# Patient Record
Sex: Female | Born: 1965 | Race: White | Hispanic: No | Smoking: Never smoker
Health system: Southern US, Community
[De-identification: ages and names within clinical notes are randomized; demographics above are authoritative.]

## PROBLEM LIST (undated history)

## (undated) DIAGNOSIS — E785 Hyperlipidemia, unspecified: Secondary | ICD-10-CM

## (undated) DIAGNOSIS — I10 Essential (primary) hypertension: Secondary | ICD-10-CM

## (undated) DIAGNOSIS — F419 Anxiety disorder, unspecified: Secondary | ICD-10-CM

## (undated) DIAGNOSIS — R51 Headache: Secondary | ICD-10-CM

## (undated) DIAGNOSIS — F329 Major depressive disorder, single episode, unspecified: Secondary | ICD-10-CM

## (undated) DIAGNOSIS — F32A Depression, unspecified: Secondary | ICD-10-CM

## (undated) HISTORY — DX: Hyperlipidemia, unspecified: E78.5

## (undated) HISTORY — DX: Major depressive disorder, single episode, unspecified: F32.9

## (undated) HISTORY — DX: Anxiety disorder, unspecified: F41.9

## (undated) HISTORY — DX: Essential (primary) hypertension: I10

## (undated) HISTORY — PX: KNEE RECONSTRUCTION: SHX5883

## (undated) HISTORY — DX: Depression, unspecified: F32.A

---

## 2002-02-21 ENCOUNTER — Encounter: Admission: RE | Admit: 2002-02-21 | Discharge: 2002-04-26 | Payer: Self-pay | Admitting: Orthopedic Surgery

## 2003-10-22 ENCOUNTER — Emergency Department (HOSPITAL_COMMUNITY): Admission: EM | Admit: 2003-10-22 | Discharge: 2003-10-23 | Payer: Self-pay | Admitting: Emergency Medicine

## 2004-08-27 ENCOUNTER — Ambulatory Visit: Payer: Self-pay | Admitting: Family Medicine

## 2004-10-02 ENCOUNTER — Ambulatory Visit: Payer: Self-pay | Admitting: Family Medicine

## 2004-10-23 ENCOUNTER — Ambulatory Visit: Payer: Self-pay | Admitting: Family Medicine

## 2004-11-21 ENCOUNTER — Ambulatory Visit: Payer: Self-pay | Admitting: Family Medicine

## 2004-11-21 ENCOUNTER — Other Ambulatory Visit: Admission: RE | Admit: 2004-11-21 | Discharge: 2004-11-21 | Payer: Self-pay | Admitting: Family Medicine

## 2005-01-19 ENCOUNTER — Ambulatory Visit: Payer: Self-pay | Admitting: Family Medicine

## 2005-01-26 ENCOUNTER — Ambulatory Visit: Payer: Self-pay | Admitting: Family Medicine

## 2005-07-08 ENCOUNTER — Ambulatory Visit: Payer: Self-pay | Admitting: Family Medicine

## 2013-07-12 ENCOUNTER — Ambulatory Visit (INDEPENDENT_AMBULATORY_CARE_PROVIDER_SITE_OTHER): Payer: BC Managed Care – PPO | Admitting: Surgery

## 2013-07-12 ENCOUNTER — Encounter (INDEPENDENT_AMBULATORY_CARE_PROVIDER_SITE_OTHER): Payer: Self-pay | Admitting: Surgery

## 2013-07-12 ENCOUNTER — Other Ambulatory Visit (INDEPENDENT_AMBULATORY_CARE_PROVIDER_SITE_OTHER): Payer: Self-pay

## 2013-07-12 NOTE — Progress Notes (Signed)
Re:   Mesa Kinch DOB:   [birth year] MRN:   [id number]  ASSESSMENT AND PLAN: 1.  Morbid obesity   Weight 286, BMI 46.3  Per the 1991 NIH Consensus Statement, the patient is a candidate for bariatric surgery.  The patient attended our initial information session and reviewed the types of bariatric surgery.    The patient is interested in the Sleeve Gastrectomy.  I discussed with the patient the indications and risks of bariatric surgery.  The potential risks of surgery include, but are not limited to, bleeding, infection, leak from the bowel, DVT and PE, open surgery, long term nutrition consequences, and death.  The patient understands the importance of compliance and long term follow-up with our group after surgery.  She is a little undecided about which operation, but is heading to the sleeve for now.  From here we will obtain lab tests, x-rays, nutrition consult, and psych consult.  I also discussed that this is the newest of the weight loss operations being offered.  I have assisted in this operation multiple times, but have just start doing the operation.  2.  HTN x 1 year 3.  Anxiety/Depression 4.  Bilateral knee surgery for chronic dislocation.  She still has some trouble  Chief Complaint  Patient presents with  . Weight Loss Surgery    initial consult/gastric sleeve   REFERRING PHYSICIAN: SLATOSKY,JOHN J., MD  HISTORY OF PRESENT ILLNESS: Eiman Maret is a 48 y.o. (DOB: 03-20-1966)  white  female whose primary care physician is SLATOSKY,JOHN J., MD and comes to me today for weight loss.  Patient describes that the patient has been interested in weight loss surgery for some time. She went to an information session several years ago. She went a more recent information session presented by Dr. Andrey Campanile.  She is interested in a sleeve gastrectomy. She is not know anyone who has a sleeve gastrectomy. Her main weight loss has been to watch her calories. She has tried Atkins in the  past. She had successful weight loss of about 46 pounds with Adipex and 2012 and but when she quit the Adipex, she regained her weight.  She has not chronic GI problems. She has had no prior abdominal surgery.   Past Medical History  Diagnosis Date  . Hyperlipidemia   . Hypertension   . Anxiety   . Depression      Past Surgical History  Procedure Laterality Date  . Knee reconstruction      knee cap     Current Outpatient Prescriptions  Medication Sig Dispense Refill  . ALPRAZolam (XANAX) 0.25 MG tablet       . aspirin 81 MG tablet Take 81 mg by mouth daily.      . citalopram (CELEXA) 40 MG tablet       . fenofibrate (TRICOR) 48 MG tablet       . lisinopril (PRINIVIL,ZESTRIL) 10 MG tablet       . metoprolol succinate (TOPROL-XL) 100 MG 24 hr tablet        No current facility-administered medications for this visit.     Not on File  REVIEW OF SYSTEMS: Skin:  No history of rash.  No history of abnormal moles. Infection:  No history of hepatitis or HIV.  No history of MRSA. Neurologic:  No history of stroke.  No history of seizure.  No history of headaches. Cardiac:  Hypertension x 1 year.  Pulmonary:  Does not smoke cigarettes.  No asthma or bronchitis.  No OSA/CPAP.  Endocrine:  No diabetes. No thyroid disease. Gastrointestinal:  See HPI. No history of stomach disease.  No history of liver disease.  No history of gall bladder disease.  No history of pancreas disease.  No history of colon disease. Urologic:  No history of kidney stones.  No history of bladder infections. GYN:  Last period about 2 weeks ago.  She has no children. Musculoskeletal:  She has had bilateral knee surgery for chronic dislocation.  She still has some trouble with dislocation. Hematologic:  No bleeding disorder.  No history of anemia.  Not anticoagulated. Psycho-social:  The patient is oriented.   The patient has no obvious psychologic or social impairment to understanding our conversation and  plan.  SOCIAL and FAMILY HISTORY: Unmarried. No children. She takes care of her father who has Alzheimer's x 6 years.  She has 2 sisters and one brother.  They rotate taking care of their father on the weekends.  PHYSICAL EXAM: BP 124/80  Pulse 96  Temp(Src) 98.7 F (37.1 C) (Oral)  Resp 15  Ht 5\' 6"  (1.676 m)  Wt 286 lb 9.6 oz (130.001 kg)  BMI 46.28 kg/m2  General: Obese WF who is alert and generally healthy appearing.  HEENT: Normal. Pupils equal. Neck: Supple. No mass.  No thyroid mass. Lymph Nodes:  No supraclavicular or cervical nodes. Lungs: Clear to auscultation and symmetric breath sounds. Heart:  RRR. No murmur or rub. Abdomen: Soft. No mass. No tenderness. No hernia. Normal bowel sounds.  No abdominal scars. Rectal: No mass.  Guaiac negative. Extremities:  Good strength and ROM  in upper and lower extremities. Neurologic:  Grossly intact to motor and sensory function. Psychiatric: Has normal mood and affect. Behavior is normal.   DATA REVIEWED: Not much in Epic  Ovidio Kinavid Alynn Ellithorpe, MD,  [facility] [place] [address]  [address]   [place], [place]    [id number] Phone:  [phone] FAX:  [phone]

## 2013-08-01 ENCOUNTER — Other Ambulatory Visit: Payer: Self-pay

## 2013-08-01 ENCOUNTER — Ambulatory Visit (HOSPITAL_COMMUNITY)
Admission: RE | Admit: 2013-08-01 | Discharge: 2013-08-01 | Disposition: A | Discharge status: Home / Self Care | Payer: BC Managed Care – PPO | Source: Ambulatory Visit | Attending: Surgery | Admitting: Surgery

## 2013-08-01 DIAGNOSIS — Z6841 Body Mass Index (BMI) 40.0 and over, adult: Secondary | ICD-10-CM | POA: Insufficient documentation

## 2013-08-01 DIAGNOSIS — M24469 Recurrent dislocation, unspecified knee: Secondary | ICD-10-CM | POA: Insufficient documentation

## 2013-08-01 DIAGNOSIS — K7689 Other specified diseases of liver: Secondary | ICD-10-CM | POA: Insufficient documentation

## 2013-08-01 DIAGNOSIS — F341 Dysthymic disorder: Secondary | ICD-10-CM | POA: Insufficient documentation

## 2013-08-01 DIAGNOSIS — K8020 Calculus of gallbladder without cholecystitis without obstruction: Secondary | ICD-10-CM | POA: Insufficient documentation

## 2013-08-01 DIAGNOSIS — I10 Essential (primary) hypertension: Secondary | ICD-10-CM | POA: Insufficient documentation

## 2013-08-02 LAB — COMPREHENSIVE METABOLIC PANEL
ALK PHOS: 38 U/L — AB (ref 39–117)
ALT: 16 U/L (ref 0–35)
AST: 17 U/L (ref 0–37)
Albumin: 4 g/dL (ref 3.5–5.2)
BILIRUBIN TOTAL: 0.4 mg/dL (ref 0.2–1.2)
BUN: 13 mg/dL (ref 6–23)
CO2: 21 mEq/L (ref 19–32)
CREATININE: 0.8 mg/dL (ref 0.50–1.10)
Calcium: 9.2 mg/dL (ref 8.4–10.5)
Chloride: 106 mEq/L (ref 96–112)
Glucose, Bld: 116 mg/dL — ABNORMAL HIGH (ref 70–99)
Potassium: 4.2 mEq/L (ref 3.5–5.3)
SODIUM: 137 meq/L (ref 135–145)
Total Protein: 6.6 g/dL (ref 6.0–8.3)

## 2013-08-02 LAB — CBC WITH DIFFERENTIAL/PLATELET
BASOS PCT: 0 % (ref 0–1)
Basophils Absolute: 0 10*3/uL (ref 0.0–0.1)
EOS ABS: 0.4 10*3/uL (ref 0.0–0.7)
Eosinophils Relative: 4 % (ref 0–5)
HEMATOCRIT: 39 % (ref 36.0–46.0)
Hemoglobin: 13.1 g/dL (ref 12.0–15.0)
Lymphocytes Relative: 24 % (ref 12–46)
Lymphs Abs: 2.3 10*3/uL (ref 0.7–4.0)
MCH: 28.4 pg (ref 26.0–34.0)
MCHC: 33.6 g/dL (ref 30.0–36.0)
MCV: 84.6 fL (ref 78.0–100.0)
MONO ABS: 0.4 10*3/uL (ref 0.1–1.0)
Monocytes Relative: 4 % (ref 3–12)
NEUTROS ABS: 6.4 10*3/uL (ref 1.7–7.7)
Neutrophils Relative %: 68 % (ref 43–77)
Platelets: 320 10*3/uL (ref 150–400)
RBC: 4.61 MIL/uL (ref 3.87–5.11)
RDW: 14.7 % (ref 11.5–15.5)
WBC: 9.4 10*3/uL (ref 4.0–10.5)

## 2013-08-02 LAB — TSH: TSH: 2.805 u[IU]/mL (ref 0.350–4.500)

## 2013-08-07 ENCOUNTER — Encounter (HOSPITAL_COMMUNITY)
Admission: RE | Disposition: A | Discharge status: Home / Self Care | Payer: Self-pay | Source: Ambulatory Visit | Attending: Surgery

## 2013-08-07 ENCOUNTER — Ambulatory Visit (HOSPITAL_COMMUNITY)
Admission: RE | Admit: 2013-08-07 | Discharge: 2013-08-07 | Disposition: A | Discharge status: Home / Self Care | Payer: BC Managed Care – PPO | Source: Ambulatory Visit | Attending: Surgery | Admitting: Surgery

## 2013-08-07 HISTORY — PX: BREATH TEK H PYLORI: SHX5422

## 2013-08-07 SURGERY — BREATH TEST, FOR HELICOBACTER PYLORI

## 2013-08-07 NOTE — Progress Notes (Signed)
08/07/13 [id number]  BREATH TEK ASSESSMENT  Referring MD Ovidio Kinavid Newman  Time of Last PO Intake 1800  Baseline Breath At: 0750  Pranactin Given At: 0752  Post-Dose Breath At: 0807  Sample 1 2.6  Sample 2 2.5  Test Negative

## 2013-08-08 ENCOUNTER — Telehealth (INDEPENDENT_AMBULATORY_CARE_PROVIDER_SITE_OTHER): Payer: Self-pay

## 2013-08-08 ENCOUNTER — Encounter (HOSPITAL_COMMUNITY): Payer: Self-pay | Admitting: Surgery

## 2013-08-21 ENCOUNTER — Encounter: Payer: BC Managed Care – PPO | Attending: Surgery | Admitting: Dietician

## 2013-08-21 ENCOUNTER — Encounter: Payer: Self-pay | Admitting: Dietician

## 2013-08-21 DIAGNOSIS — Z713 Dietary counseling and surveillance: Secondary | ICD-10-CM | POA: Insufficient documentation

## 2013-08-21 DIAGNOSIS — Z01818 Encounter for other preprocedural examination: Secondary | ICD-10-CM | POA: Insufficient documentation

## 2013-08-21 NOTE — Progress Notes (Signed)
  Pre-Op Assessment Visit:  Pre-Operative Sleeve Gastrectomy Surgery  Medical Nutrition Therapy:  Appt start time: 1350   End time:  1430.  Patient was seen on 08/21/2013 for Pre-Operative Sleeve Gastrectomy Nutrition Assessment. Assessment and letter of approval faxed to [facility] coordinator on 08/21/2013.   Preferred Learning Style:   No preference indicated   Learning Readiness:   Ready  Handouts given during visit include:  Pre-Op Goals Bariatric Surgery Protein Shakes  Teaching Method Utilized:  Visual Auditory Hands on  Barriers to learning/adherence to lifestyle change: none  Demonstrated degree of understanding via:  Teach Back   Patient to call the [facility] to enroll in Pre-Op and Post-Op Nutrition Education when surgery date is scheduled.

## 2013-08-21 NOTE — Patient Instructions (Signed)
Work on [employer] CorporationPre Op Goals. Call [facility] once surgery is scheduled to enroll in Pre-Op Class. Check to to see if protein is less than 5 g of carbs or email me [email or web address].

## 2013-09-21 ENCOUNTER — Other Ambulatory Visit (INDEPENDENT_AMBULATORY_CARE_PROVIDER_SITE_OTHER): Payer: Self-pay | Admitting: Surgery

## 2013-10-02 ENCOUNTER — Encounter: Payer: BC Managed Care – PPO | Attending: Surgery

## 2013-10-02 DIAGNOSIS — Z01818 Encounter for other preprocedural examination: Secondary | ICD-10-CM | POA: Insufficient documentation

## 2013-10-02 DIAGNOSIS — Z713 Dietary counseling and surveillance: Secondary | ICD-10-CM | POA: Insufficient documentation

## 2013-10-03 NOTE — Progress Notes (Signed)
  Pre-Operative Nutrition Class:  Appt start time: 830   End time:  930.  Patient was seen on 10/02/2013 for Pre-Operative Bariatric Surgery Education at [facility].   Surgery date: 10/17/2013 Surgery type: Gastric sleeve Start weight at [facility]: 275 lbs on 08/21/2013 Weight today: 265.5 lbs  TANITA  BODY COMP RESULTS  10/02/2013   BMI (kg/m^2) 42.9   Fat Mass (lbs) 118.5   Fat Free Mass (lbs) 147   Total Body Water (lbs) 107.5   Samples given per MNT protocol. Patient educated on appropriate usage:  Unjury protein powder (chocolate - qty 1) Lot #: [id number] Exp: 09/2014  Premier protein shake (strawberry - qty 1) Lot#: [id number] Exp: 03/2014  Bariatric Advantage calcium citrate (cinnamon - qty 1) Lot #: [id number] Exp: 11/2013  Bariactiv MVI (qty 1) Lot #: [id number] Exp: 08/2014  The following the learning objectives were met by the patient during this course:  Identify Pre-Op Dietary Goals and will begin 2 weeks pre-operatively  Identify appropriate sources of fluids and proteins   State protein recommendations and appropriate sources pre and post-operatively  Identify Post-Operative Dietary Goals and will follow for 2 weeks post-operatively  Identify appropriate multivitamin and calcium sources  Describe the need for physical activity post-operatively and will follow MD recommendations  State when to call healthcare [facility] regarding medication questions or post-operative complications  Handouts given during class include:  Pre-Op Bariatric Surgery Diet Handout  Protein Shake Handout  Post-Op Bariatric Surgery Nutrition Handout  BELT Program Information Flyer  Support Group Information Flyer  [facility] Bariatric Supplements Price List  Follow-Up Plan: Patient will follow-up at [facility] 2 weeks post operatively for diet advancement per MD.

## 2013-10-05 ENCOUNTER — Encounter (HOSPITAL_COMMUNITY): Payer: Self-pay | Admitting: Pharmacy Technician

## 2013-10-11 ENCOUNTER — Encounter (INDEPENDENT_AMBULATORY_CARE_PROVIDER_SITE_OTHER): Payer: Self-pay | Admitting: Surgery

## 2013-10-11 ENCOUNTER — Ambulatory Visit (INDEPENDENT_AMBULATORY_CARE_PROVIDER_SITE_OTHER): Payer: BC Managed Care – PPO | Admitting: Surgery

## 2013-10-11 NOTE — Progress Notes (Signed)
Re:   Alison Burch DOB:   1966/02/11 MRN:   [id number]  ASSESSMENT AND PLAN: 1.  Morbid obesity   Weight 286, BMI 46.3  Per the [place], the patient is a candidate for bariatric surgery.  The patient attended our initial information session and reviewed the types of bariatric surgery.    The patient is interested in the Sleeve Gastrectomy.  I discussed with the patient the indications and risks of bariatric surgery.  The potential risks of surgery include, but are not limited to, bleeding, infection, leak from the bowel, DVT and PE, open surgery, long term nutrition consequences, and death.  The patient understands the importance of compliance and long term follow-up with our group after surgery.  She is for surgery 10/17/2013.  Her sister, [name], acts as her POA.  I gave her a prescription for Oxycodone elixir - 200 cc.   2.  HTN x 1 year 3.  Anxiety/Depression 4.  Bilateral knee surgery for chronic dislocation.  She still has some trouble 5.  Asymptomatic gallstones - I gave her a copy of the [place] report  Chief Complaint  Patient presents with  . Bariatric Follow Up   REFERRING PHYSICIAN: SLATOSKY,JOHN J., MD  HISTORY OF PRESENT ILLNESS: Alison Burch is a 48 y.o. (DOB: [birth year])  white  female whose primary care physician is SLATOSKY,JOHN J., MD and comes to me today for weight loss.  Comes by herself.  She still has not met someone who has had a sleeve gastrectomy, but she feels that she knows the surgery well.  I reviewed all her tests with her and gave her copies of her [place] report.  She ask about cirrhosis of the liver.  Apparently her mother has cirrhosis.  She has some steatosis on the [place], but this is reversible with weight loss.  She has lost 23 pounds since I saw her last time.  This is a good sign that she is plugged into the surgery and its effects. UGI - 08/01/2013 - Normal US abdomen - 08/01/2013 -  Gallstones (copy to patient) She saw Dr.  Ardath Sax for psych - 08/26/2013 - he thought she was a good candidate  History of weight loss: Patient describes that the patient has been interested in weight loss surgery for some time. She went to an information session several years ago. She went a more recent information session presented by Dr. Redmond Pulling. She is interested in a sleeve gastrectomy. She is not know anyone who has a sleeve gastrectomy. Her main weight loss has been to watch her calories. She has tried Atkins in the past. She had successful weight loss of about 46 pounds with Adipex and 2012 and but when she quit the Adipex, she regained her weight.  She has not chronic GI problems. She has had no prior abdominal surgery.   Past Medical History  Diagnosis Date  . Hyperlipidemia   . Hypertension   . Anxiety   . Depression      Past Surgical History  Procedure Laterality Date  . Knee reconstruction      knee cap  . Breath tek h pylori N/A 08/07/2013    Procedure: BREATH TEK H PYLORI;  Surgeon: Shann Medal, MD;  Location: [facility];  Service: General;  Laterality: N/A;     Current Outpatient Prescriptions  Medication Sig Dispense Refill  . Acetaminophen-Aspirin Buffered (EXCEDRIN BACK & BODY PO) Take 1 tablet by mouth every 8 (eight) hours as needed (for  pain).      Marland Kitchen ALPRAZolam (XANAX) 0.25 MG tablet Take 0.25 mg by mouth at bedtime as needed for sleep.       . citalopram (CELEXA) 40 MG tablet Take 40 mg by mouth every morning.       . fenofibrate (TRICOR) 48 MG tablet Take 48 mg by mouth daily.       Marland Kitchen ibuprofen (ADVIL,MOTRIN) 200 MG tablet Take 800 mg by mouth every 6 (six) hours as needed for mild pain or moderate pain.      Marland Kitchen lisinopril (PRINIVIL,ZESTRIL) 10 MG tablet Take 10 mg by mouth every morning.       . Melatonin 10 MG TABS Take 10 mg by mouth at bedtime as needed (for sleep).       . metoprolol succinate (TOPROL-XL) 100 MG 24 hr tablet Take 100 mg by mouth every morning.        No current  facility-administered medications for this visit.     No Known Allergies  REVIEW OF SYSTEMS: Skin:  No history of rash.  No history of abnormal moles. Infection:  No history of hepatitis or HIV.  No history of MRSA. Neurologic:  No history of stroke.  No history of seizure.  No history of headaches. Cardiac:  Hypertension x 1 year.  Pulmonary:  Does not smoke cigarettes.  No asthma or bronchitis.  No OSA/CPAP.  Endocrine:  No diabetes. No thyroid disease. Gastrointestinal:  See HPI. No history of stomach disease.  No history of liver disease.  No history of gall bladder disease.  No history of pancreas disease.  No history of colon disease. Urologic:  No history of kidney stones.  No history of bladder infections. GYN:  Last period about 2 weeks ago.  She has no children. Musculoskeletal:  She has had bilateral knee surgery for chronic dislocation.  She still has some trouble with dislocation. Hematologic:  No bleeding disorder.  No history of anemia.  Not anticoagulated. Psycho-social:  She saw Dr. Ardath Sax.  History of depression and anxiety.  SOCIAL and FAMILY HISTORY: Unmarried. No children. She takes care of her father who has Alzheimer's x 6 years.  She has no other job. She has 2 sisters and one brother.  They rotate taking care of their father on the weekends. Her sister, [name], acts as her POA.  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  Her husband has had colon issues and [name] has had to miss work to take care of her.  So it sounds like no one will be with [name] the day of surgery.  PHYSICAL EXAM: BP 134/70  Pulse 76  Temp(Src) 98.1 F (36.7 C)  Ht $R'5\' 6"'Ix$  (1.676 m)  Wt 263 lb (119.296 kg)  BMI 42.47 kg/m2  General: Obese WF who is alert and generally healthy appearing.  HEENT: Normal. Pupils equal. Neck: Supple. No mass.  No thyroid mass. Lymph Nodes:  No supraclavicular or cervical nodes. Lungs: Clear to auscultation and symmetric breath  sounds. Heart:  RRR. No murmur or rub.  Abdomen: Soft. No mass. No tenderness. No hernia. Normal bowel sounds.  No abdominal scars.  She is about half apple and half pear. Extremities:  Good strength and ROM  in upper and lower extremities. Neurologic:  Grossly intact to motor and sensory function. Psychiatric: Has normal mood and affect. Behavior is normal.   DATA REVIEWED: Not much in [place], MD,  [facility] [place]  [address]  [place], [place]    [place] Phone:  [phone] FAX:  [phone]

## 2013-10-12 NOTE — Patient Instructions (Signed)
Alison Burch  10/12/2013   Your procedure is scheduled on:  10/17/2013  1020am-1255pm  [facility]  Follow the Signs to Short Stay Center at 0820       am  Call this number if you have problems the morning of surgery: [phone]   Remember:   Do not eat food or drink liquids after midnight.   Take these medicines the morning of surgery with A SIP OF WATER:    Do not wear jewelry, make-up or nail polish.  Do not wear lotions, powders, or perfumes. , deodorant   Do not shave 48 hours prior to surgery.   Do not bring valuables to the hospital.  Contacts, dentures or bridgework may not be worn into surgery.  Leave suitcase in the car. After surgery it may be brought to your room.  For patients admitted to the hospital, checkout time is 11:00 AM the day of  discharge.    Please read over the following fact sheets that you were [facility] Pre[facility] Before surgery, you can play an important role.  Because skin is not sterile, your skin needs to be as free of germs as possible.  You can reduce the number of germs on your skin by washing with CHG (chlorahexidine gluconate) soap before surgery.  CHG is an antiseptic cleaner which kills germs and bonds with the skin to continue killing germs even after washing. Please DO NOT use if you have an allergy to CHG or antibacterial soaps.  If your skin becomes reddened/irritated stop using the CHG and inform your nurse when you arrive at Short Stay. Do not shave (including legs and underarms) for at least 48 hours prior to the first CHG shower.  You may shave your face/neck. Please follow these instructions carefully:  1.  Shower with CHG Soap the night before surgery and the  morning of Surgery.  2.  If you choose to wash your hair, wash your hair first as usual with your  normal  shampoo.  3.  After you shampoo, rinse your hair and body thoroughly to remove the  shampoo.                           4.  Use CHG as  you would any other liquid soap.  You can apply chg directly  to the skin and wash                       Gently with a scrungie or clean washcloth.  5.  Apply the CHG Soap to your body ONLY FROM THE NECK DOWN.   Do not use on face/ open                           Wound or open sores. Avoid contact with eyes, ears mouth and genitals (private parts).                       Wash face,  Genitals (private parts) with your normal soap.             6.  Wash thoroughly, paying special attention to the area where your surgery  will be performed.  7.  Thoroughly rinse your body with warm water from the neck down.  8.  DO NOT shower/wash with your normal soap after using and rinsing off  the CHG Soap.                9.  Pat yourself dry with a clean towel.            10.  Wear clean pajamas.            11.  Place clean sheets on your bed the night of your first shower and do not  sleep with pets. Day of Surgery : Do not apply any lotions/deodorants the morning of surgery.  Please wear clean clothes to the hospital/surgery center.  FAILURE TO FOLLOW THESE INSTRUCTIONS MAY RESULT IN THE CANCELLATION OF YOUR SURGERY PATIENT SIGNATURE_________________________________  NURSE SIGNATURE__________________________________  ________________________________________________________________________  coughing and deep breathing exercises, leg exercises

## 2013-10-13 ENCOUNTER — Ambulatory Visit (HOSPITAL_COMMUNITY)
Admission: RE | Admit: 2013-10-13 | Discharge: 2013-10-13 | Disposition: A | Discharge status: Home / Self Care | Payer: BC Managed Care – PPO | Source: Ambulatory Visit | Attending: Surgery | Admitting: Surgery

## 2013-10-13 ENCOUNTER — Encounter (HOSPITAL_COMMUNITY)
Admission: RE | Admit: 2013-10-13 | Discharge: 2013-10-13 | Disposition: A | Discharge status: Home / Self Care | Payer: BC Managed Care – PPO | Source: Ambulatory Visit | Attending: Surgery | Admitting: Surgery

## 2013-10-13 ENCOUNTER — Encounter (HOSPITAL_COMMUNITY): Payer: Self-pay

## 2013-10-13 DIAGNOSIS — Z01812 Encounter for preprocedural laboratory examination: Secondary | ICD-10-CM | POA: Insufficient documentation

## 2013-10-13 DIAGNOSIS — Z01818 Encounter for other preprocedural examination: Secondary | ICD-10-CM | POA: Insufficient documentation

## 2013-10-13 HISTORY — DX: Headache: R51

## 2013-10-13 LAB — COMPREHENSIVE METABOLIC PANEL
ALK PHOS: 44 U/L (ref 39–117)
ALT: 11 U/L (ref 0–35)
AST: 14 U/L (ref 0–37)
Albumin: 3.7 g/dL (ref 3.5–5.2)
BUN: 16 mg/dL (ref 6–23)
CO2: 21 mEq/L (ref 19–32)
Calcium: 9.3 mg/dL (ref 8.4–10.5)
Chloride: 101 mEq/L (ref 96–112)
Creatinine, Ser: 0.75 mg/dL (ref 0.50–1.10)
GFR calc Af Amer: >90 mL/min (ref >90)
GFR calc non Af Amer: >90 mL/min (ref >90)
GLUCOSE: 117 mg/dL — AB (ref 70–99)
POTASSIUM: 4.5 meq/L (ref 3.7–5.3)
SODIUM: 135 meq/L — AB (ref 137–147)
Total Bilirubin: 0.5 mg/dL (ref 0.3–1.2)
Total Protein: 7.3 g/dL (ref 6.0–8.3)

## 2013-10-13 LAB — CBC WITH DIFFERENTIAL/PLATELET
BASOS ABS: 0 10*3/uL (ref 0.0–0.1)
Basophils Relative: 0 % (ref 0–1)
EOS ABS: 0.2 10*3/uL (ref 0.0–0.7)
EOS PCT: 2 % (ref 0–5)
HCT: 39.4 % (ref 36.0–46.0)
Hemoglobin: 13.6 g/dL (ref 12.0–15.0)
LYMPHS ABS: 2 10*3/uL (ref 0.7–4.0)
LYMPHS PCT: 20 % (ref 12–46)
MCH: 28.7 pg (ref 26.0–34.0)
MCHC: 34.5 g/dL (ref 30.0–36.0)
MCV: 83.1 fL (ref 78.0–100.0)
Monocytes Absolute: 0.5 10*3/uL (ref 0.1–1.0)
Monocytes Relative: 5 % (ref 3–12)
NEUTROS PCT: 73 % (ref 43–77)
Neutro Abs: 7.1 10*3/uL (ref 1.7–7.7)
PLATELETS: 327 10*3/uL (ref 150–400)
RBC: 4.74 MIL/uL (ref 3.87–5.11)
RDW: 13.1 % (ref 11.5–15.5)
WBC: 9.7 10*3/uL (ref 4.0–10.5)

## 2013-10-13 LAB — HCG, SERUM, QUALITATIVE: Preg, Serum: NEGATIVE

## 2013-10-13 NOTE — Progress Notes (Signed)
EKG- 08/01/13 EPIC  CXR 10/13/12 EPIC

## 2013-10-17 ENCOUNTER — Encounter (HOSPITAL_COMMUNITY): Payer: BC Managed Care – PPO | Admitting: Certified Registered Nurse Anesthetist

## 2013-10-17 ENCOUNTER — Encounter (HOSPITAL_COMMUNITY)
Admission: RE | Disposition: A | Discharge status: Home / Self Care | Payer: Self-pay | Source: Ambulatory Visit | Attending: Surgery

## 2013-10-17 ENCOUNTER — Encounter (HOSPITAL_COMMUNITY): Payer: Self-pay | Admitting: *Deleted

## 2013-10-17 ENCOUNTER — Ambulatory Visit (HOSPITAL_COMMUNITY): Payer: BC Managed Care – PPO | Admitting: Certified Registered Nurse Anesthetist

## 2013-10-17 ENCOUNTER — Inpatient Hospital Stay (HOSPITAL_COMMUNITY)
Admission: RE | Admit: 2013-10-17 | Discharge: 2013-10-19 | DRG: 621 | Disposition: A | Discharge status: Home / Self Care | Payer: BC Managed Care – PPO | Source: Ambulatory Visit | Attending: Surgery | Admitting: Surgery

## 2013-10-17 DIAGNOSIS — F411 Generalized anxiety disorder: Secondary | ICD-10-CM | POA: Diagnosis present

## 2013-10-17 DIAGNOSIS — E785 Hyperlipidemia, unspecified: Secondary | ICD-10-CM

## 2013-10-17 DIAGNOSIS — F3289 Other specified depressive episodes: Secondary | ICD-10-CM | POA: Diagnosis present

## 2013-10-17 DIAGNOSIS — Z79899 Other long term (current) drug therapy: Secondary | ICD-10-CM | POA: Diagnosis not present

## 2013-10-17 DIAGNOSIS — K8020 Calculus of gallbladder without cholecystitis without obstruction: Secondary | ICD-10-CM | POA: Diagnosis present

## 2013-10-17 DIAGNOSIS — F329 Major depressive disorder, single episode, unspecified: Secondary | ICD-10-CM | POA: Diagnosis present

## 2013-10-17 DIAGNOSIS — I10 Essential (primary) hypertension: Secondary | ICD-10-CM | POA: Diagnosis present

## 2013-10-17 DIAGNOSIS — Z6841 Body Mass Index (BMI) 40.0 and over, adult: Secondary | ICD-10-CM

## 2013-10-17 HISTORY — PX: LAPAROSCOPIC GASTRIC SLEEVE RESECTION: SHX5895

## 2013-10-17 HISTORY — PX: UPPER GI ENDOSCOPY: SHX6162

## 2013-10-17 LAB — HEMOGLOBIN AND HEMATOCRIT, BLOOD
HEMATOCRIT: 38.8 % (ref 36.0–46.0)
Hemoglobin: 13 g/dL (ref 12.0–15.0)

## 2013-10-17 SURGERY — GASTRECTOMY, SLEEVE, LAPAROSCOPIC
Anesthesia: General

## 2013-10-17 MED ORDER — KETAMINE HCL 10 MG/ML IJ SOLN
INTRAMUSCULAR | Status: DC | PRN
  Administered 2013-10-17: 20 mg via INTRAVENOUS

## 2013-10-17 MED ORDER — GLYCOPYRROLATE 0.2 MG/ML IJ SOLN
INTRAMUSCULAR | Status: DC | PRN
  Administered 2013-10-17: .8 mg via INTRAVENOUS

## 2013-10-17 MED ORDER — MORPHINE SULFATE 2 MG/ML IJ SOLN
2.0000 mg | INTRAMUSCULAR | Status: DC | PRN
  Administered 2013-10-17 – 2013-10-18 (×2): 2 mg via INTRAVENOUS
  Administered 2013-10-18 (×3): 4 mg via INTRAVENOUS
  Administered 2013-10-18: 2 mg via INTRAVENOUS
  Filled 2013-10-17: qty 1
  Filled 2013-10-17 (×3): qty 2
  Filled 2013-10-17 (×2): qty 1

## 2013-10-17 MED ORDER — ROCURONIUM BROMIDE 100 MG/10ML IV SOLN
INTRAVENOUS | Status: AC
  Filled 2013-10-17: qty 1

## 2013-10-17 MED ORDER — BUPIVACAINE HCL 0.25 % IJ SOLN
INTRAMUSCULAR | Status: DC | PRN
  Administered 2013-10-17: 30 mL

## 2013-10-17 MED ORDER — TISSEEL VH 10 ML EX KIT
PACK | CUTANEOUS | Status: DC | PRN
  Administered 2013-10-17: 2

## 2013-10-17 MED ORDER — HYDROMORPHONE HCL PF 1 MG/ML IJ SOLN
INTRAMUSCULAR | Status: AC
  Filled 2013-10-17: qty 1

## 2013-10-17 MED ORDER — BUPIVACAINE HCL (PF) 0.25 % IJ SOLN
INTRAMUSCULAR | Status: AC
  Filled 2013-10-17: qty 30

## 2013-10-17 MED ORDER — CHLORHEXIDINE GLUCONATE 4 % EX LIQD: 60.0000 mL | Freq: Once | CUTANEOUS | Status: DC

## 2013-10-17 MED ORDER — KETAMINE HCL 10 MG/ML IJ SOLN
INTRAMUSCULAR | Status: AC
  Filled 2013-10-17: qty 1

## 2013-10-17 MED ORDER — MIDAZOLAM HCL 2 MG/2ML IJ SOLN
INTRAMUSCULAR | Status: AC
  Filled 2013-10-17: qty 2

## 2013-10-17 MED ORDER — UNJURY VANILLA POWDER
2.0000 [oz_av] | Freq: Four times a day (QID) | ORAL | Status: DC
  Administered 2013-10-19: 09:00:00 via ORAL

## 2013-10-17 MED ORDER — LIDOCAINE HCL (CARDIAC) 20 MG/ML IV SOLN
INTRAVENOUS | Status: AC
  Filled 2013-10-17: qty 5

## 2013-10-17 MED ORDER — METOPROLOL TARTRATE 1 MG/ML IV SOLN
5.0000 mg | Freq: Four times a day (QID) | INTRAVENOUS | Status: DC
  Administered 2013-10-17 – 2013-10-19 (×7): 5 mg via INTRAVENOUS
  Filled 2013-10-17 (×11): qty 5

## 2013-10-17 MED ORDER — MEPERIDINE HCL 50 MG/ML IJ SOLN: 6.2500 mg | INTRAMUSCULAR | Status: DC | PRN

## 2013-10-17 MED ORDER — FENTANYL CITRATE 0.05 MG/ML IJ SOLN
INTRAMUSCULAR | Status: DC | PRN
  Administered 2013-10-17 (×4): 50 ug via INTRAVENOUS
  Administered 2013-10-17 (×2): 25 ug via INTRAVENOUS
  Administered 2013-10-17 (×2): 50 ug via INTRAVENOUS

## 2013-10-17 MED ORDER — HEPARIN SODIUM (PORCINE) 5000 UNIT/ML IJ SOLN
5000.0000 [IU] | Freq: Three times a day (TID) | INTRAMUSCULAR | Status: DC
  Administered 2013-10-17 – 2013-10-19 (×5): 5000 [IU] via SUBCUTANEOUS
  Filled 2013-10-17 (×8): qty 1

## 2013-10-17 MED ORDER — ONDANSETRON HCL 4 MG/2ML IJ SOLN
INTRAMUSCULAR | Status: AC
  Filled 2013-10-17: qty 2

## 2013-10-17 MED ORDER — NEOSTIGMINE METHYLSULFATE 10 MG/10ML IV SOLN
INTRAVENOUS | Status: DC | PRN
  Administered 2013-10-17: 5 mg via INTRAVENOUS

## 2013-10-17 MED ORDER — DEXTROSE 5 % IV SOLN
INTRAVENOUS | Status: AC
  Filled 2013-10-17: qty 2

## 2013-10-17 MED ORDER — OXYCODONE HCL 5 MG/5ML PO SOLN
5.0000 mg | ORAL | Status: DC | PRN
  Administered 2013-10-18: 5 mg via ORAL
  Administered 2013-10-18: 10 mg via ORAL
  Administered 2013-10-19 (×2): 5 mg via ORAL
  Filled 2013-10-17: qty 25
  Filled 2013-10-17 (×2): qty 10

## 2013-10-17 MED ORDER — NEOSTIGMINE METHYLSULFATE 10 MG/10ML IV SOLN
INTRAVENOUS | Status: AC
  Filled 2013-10-17: qty 1

## 2013-10-17 MED ORDER — CEFOXITIN SODIUM 2 G IV SOLR
2.0000 g | INTRAVENOUS | Status: AC
  Administered 2013-10-17: 2 g via INTRAVENOUS

## 2013-10-17 MED ORDER — GLYCOPYRROLATE 0.2 MG/ML IJ SOLN
INTRAMUSCULAR | Status: AC
  Filled 2013-10-17: qty 3

## 2013-10-17 MED ORDER — FENTANYL CITRATE 0.05 MG/ML IJ SOLN
INTRAMUSCULAR | Status: AC
  Filled 2013-10-17: qty 5

## 2013-10-17 MED ORDER — SUCCINYLCHOLINE CHLORIDE 20 MG/ML IJ SOLN
INTRAMUSCULAR | Status: DC | PRN
  Administered 2013-10-17: 140 mg via INTRAVENOUS

## 2013-10-17 MED ORDER — ROCURONIUM BROMIDE 100 MG/10ML IV SOLN
INTRAVENOUS | Status: DC | PRN
  Administered 2013-10-17 (×2): 5 mg via INTRAVENOUS
  Administered 2013-10-17 (×3): 10 mg via INTRAVENOUS
  Administered 2013-10-17: 40 mg via INTRAVENOUS

## 2013-10-17 MED ORDER — PROMETHAZINE HCL 25 MG/ML IJ SOLN
6.2500 mg | INTRAMUSCULAR | Status: DC | PRN
Start: 2013-10-17 — End: 2013-10-17

## 2013-10-17 MED ORDER — EPHEDRINE SULFATE 50 MG/ML IJ SOLN
INTRAMUSCULAR | Status: AC
  Filled 2013-10-17: qty 1

## 2013-10-17 MED ORDER — PHENYLEPHRINE 40 MCG/ML (10ML) SYRINGE FOR IV PUSH (FOR BLOOD PRESSURE SUPPORT)
PREFILLED_SYRINGE | INTRAVENOUS | Status: AC
  Filled 2013-10-17: qty 10

## 2013-10-17 MED ORDER — ACETAMINOPHEN 160 MG/5ML PO SOLN: 650.0000 mg | ORAL | Status: DC | PRN

## 2013-10-17 MED ORDER — GLYCOPYRROLATE 0.2 MG/ML IJ SOLN
INTRAMUSCULAR | Status: AC
  Filled 2013-10-17: qty 4

## 2013-10-17 MED ORDER — PROPOFOL 10 MG/ML IV BOLUS
INTRAVENOUS | Status: DC | PRN
  Administered 2013-10-17: 200 mg via INTRAVENOUS

## 2013-10-17 MED ORDER — FENTANYL CITRATE 0.05 MG/ML IJ SOLN
INTRAMUSCULAR | Status: AC
  Filled 2013-10-17: qty 2

## 2013-10-17 MED ORDER — PHENYLEPHRINE HCL 10 MG/ML IJ SOLN
INTRAMUSCULAR | Status: DC | PRN
  Administered 2013-10-17: 40 ug via INTRAVENOUS
  Administered 2013-10-17 (×2): 80 ug via INTRAVENOUS
  Administered 2013-10-17: 40 ug via INTRAVENOUS
  Administered 2013-10-17: 80 ug via INTRAVENOUS

## 2013-10-17 MED ORDER — LACTATED RINGERS IR SOLN
Status: DC | PRN
  Administered 2013-10-17: 3000 mL

## 2013-10-17 MED ORDER — KCL IN DEXTROSE-NACL 20-5-0.45 MEQ/L-%-% IV SOLN
INTRAVENOUS | Status: DC
  Administered 2013-10-17 – 2013-10-18 (×3): via INTRAVENOUS
  Administered 2013-10-18 (×2): 150 mL via INTRAVENOUS
  Administered 2013-10-19: 01:00:00 via INTRAVENOUS
  Filled 2013-10-17 (×7): qty 1000

## 2013-10-17 MED ORDER — DEXAMETHASONE SODIUM PHOSPHATE 10 MG/ML IJ SOLN
INTRAMUSCULAR | Status: DC | PRN
  Administered 2013-10-17: 10 mg via INTRAVENOUS

## 2013-10-17 MED ORDER — ACETAMINOPHEN 160 MG/5ML PO SOLN: 325.0000 mg | ORAL | Status: DC | PRN

## 2013-10-17 MED ORDER — MIDAZOLAM HCL 5 MG/5ML IJ SOLN
INTRAMUSCULAR | Status: DC | PRN
  Administered 2013-10-17: 2 mg via INTRAVENOUS

## 2013-10-17 MED ORDER — DEXTROSE 5 % IV SOLN
2.0000 g | Freq: Four times a day (QID) | INTRAVENOUS | Status: AC
  Administered 2013-10-17: 2 g via INTRAVENOUS
  Filled 2013-10-17: qty 2

## 2013-10-17 MED ORDER — HYDROMORPHONE HCL PF 1 MG/ML IJ SOLN
0.2500 mg | INTRAMUSCULAR | Status: DC | PRN
  Administered 2013-10-17: 0.5 mg via INTRAVENOUS

## 2013-10-17 MED ORDER — TISSEEL VH 10 ML EX KIT
PACK | CUTANEOUS | Status: AC
  Filled 2013-10-17: qty 2

## 2013-10-17 MED ORDER — HEPARIN SODIUM (PORCINE) 5000 UNIT/ML IJ SOLN
5000.0000 [IU] | INTRAMUSCULAR | Status: AC
  Administered 2013-10-17: 5000 [IU] via SUBCUTANEOUS
  Filled 2013-10-17: qty 1

## 2013-10-17 MED ORDER — LIDOCAINE HCL (CARDIAC) 20 MG/ML IV SOLN
INTRAVENOUS | Status: DC | PRN
  Administered 2013-10-17: 100 mg via INTRAVENOUS

## 2013-10-17 MED ORDER — DEXAMETHASONE SODIUM PHOSPHATE 10 MG/ML IJ SOLN
INTRAMUSCULAR | Status: AC
  Filled 2013-10-17: qty 1

## 2013-10-17 MED ORDER — ACETAMINOPHEN 10 MG/ML IV SOLN
1000.0000 mg | Freq: Once | INTRAVENOUS | Status: AC
  Administered 2013-10-17: 1000 mg via INTRAVENOUS
  Filled 2013-10-17: qty 100

## 2013-10-17 MED ORDER — PROPOFOL 10 MG/ML IV BOLUS
INTRAVENOUS | Status: AC
  Filled 2013-10-17: qty 20

## 2013-10-17 MED ORDER — BUPIVACAINE-EPINEPHRINE 0.25% -1:200000 IJ SOLN
INTRAMUSCULAR | Status: AC
  Filled 2013-10-17: qty 1

## 2013-10-17 MED ORDER — 0.9 % SODIUM CHLORIDE (POUR BTL) OPTIME
TOPICAL | Status: DC | PRN
  Administered 2013-10-17: 1000 mL

## 2013-10-17 MED ORDER — ONDANSETRON HCL 4 MG/2ML IJ SOLN
INTRAMUSCULAR | Status: DC | PRN
  Administered 2013-10-17: 4 mg via INTRAVENOUS

## 2013-10-17 MED ORDER — UNJURY CHOCOLATE CLASSIC POWDER: 2.0000 [oz_av] | Freq: Four times a day (QID) | ORAL | Status: DC

## 2013-10-17 MED ORDER — LACTATED RINGERS IV SOLN
INTRAVENOUS | Status: DC | PRN
  Administered 2013-10-17 (×3): via INTRAVENOUS

## 2013-10-17 MED ORDER — UNJURY CHICKEN SOUP POWDER: 2.0000 [oz_av] | Freq: Four times a day (QID) | ORAL | Status: DC

## 2013-10-17 MED ORDER — ONDANSETRON HCL 4 MG/2ML IJ SOLN
4.0000 mg | INTRAMUSCULAR | Status: DC | PRN
  Administered 2013-10-18 (×4): 4 mg via INTRAVENOUS
  Filled 2013-10-17 (×4): qty 2

## 2013-10-17 MED ORDER — SODIUM CHLORIDE 0.9 % IJ SOLN
INTRAMUSCULAR | Status: AC
  Filled 2013-10-17: qty 10

## 2013-10-17 SURGICAL SUPPLY — 54 items
APPLICATOR COTTON TIP 6IN STRL (MISCELLANEOUS) IMPLANT
APPLIER CLIP ROT 10 11.4 M/L (STAPLE)
APPLIER CLIP ROT 13.4 12 LRG (CLIP)
BLADE SURG 15 STRL LF DISP TIS (BLADE) ×2 IMPLANT
BLADE SURG 15 STRL SS (BLADE) ×2
CABLE HIGH FREQUENCY MONO STRZ (ELECTRODE) ×4 IMPLANT
CHLORAPREP W/TINT 26ML (MISCELLANEOUS) ×8 IMPLANT
CLIP APPLIE ROT 10 11.4 M/L (STAPLE) IMPLANT
CLIP APPLIE ROT 13.4 12 LRG (CLIP) IMPLANT
DERMABOND ADVANCED (GAUZE/BANDAGES/DRESSINGS) ×2
DERMABOND ADVANCED .7 DNX12 (GAUZE/BANDAGES/DRESSINGS) ×2 IMPLANT
DEVICE SUTURE ENDOST 10MM (ENDOMECHANICALS) IMPLANT
DEVICE TROCAR PUNCTURE CLOSURE (ENDOMECHANICALS) ×4 IMPLANT
DRAPE CAMERA CLOSED 9X96 (DRAPES) IMPLANT
DRAPE UTILITY XL STRL (DRAPES) ×8 IMPLANT
ELECT REM PT RETURN 9FT ADLT (ELECTROSURGICAL) ×4
ELECTRODE REM PT RTRN 9FT ADLT (ELECTROSURGICAL) ×2 IMPLANT
EVACUATOR SMOKE 8.L (FILTER) ×4 IMPLANT
GAUZE SPONGE 4X4 12PLY STRL (GAUZE/BANDAGES/DRESSINGS) IMPLANT
GLOVE SURG SIGNA 7.5 PF LTX (GLOVE) ×4 IMPLANT
GOWN STRL REUS W/TWL XL LVL3 (GOWN DISPOSABLE) ×20 IMPLANT
HOVERMATT SINGLE USE (MISCELLANEOUS) ×4 IMPLANT
KIT BASIN OR (CUSTOM PROCEDURE TRAY) ×4 IMPLANT
NEEDLE SPNL 22GX3.5 QUINCKE BK (NEEDLE) ×4 IMPLANT
PACK UNIVERSAL I (CUSTOM PROCEDURE TRAY) ×4 IMPLANT
PEN SKIN MARKING BROAD (MISCELLANEOUS) ×4 IMPLANT
RELOAD BLUE (STAPLE) ×16 IMPLANT
RELOAD GOLD (STAPLE) ×8 IMPLANT
RELOAD GREEN (STAPLE) ×8 IMPLANT
SCISSORS LAP 5X35 DISP (ENDOMECHANICALS) ×4 IMPLANT
SEALANT SURGICAL APPL DUAL CAN (MISCELLANEOUS) ×4 IMPLANT
SET IRRIG TUBING LAPAROSCOPIC (IRRIGATION / IRRIGATOR) ×4 IMPLANT
SHEARS CURVED HARMONIC AC 45CM (MISCELLANEOUS) ×4 IMPLANT
SLEEVE ADV FIXATION 5X100MM (TROCAR) ×4 IMPLANT
SLEEVE GASTRECTOMY 36FR VISIGI (MISCELLANEOUS) ×4 IMPLANT
SOLUTION ANTI FOG 6CC (MISCELLANEOUS) ×4 IMPLANT
SPONGE LAP 18X18 X RAY DECT (DISPOSABLE) ×4 IMPLANT
STAPLE ECHEON FLEX 60 POW ENDO (STAPLE) ×4 IMPLANT
SUT ETHILON 2 0 PS N (SUTURE) IMPLANT
SUT MON AB 5-0 PS2 18 (SUTURE) ×4 IMPLANT
SUT VICRYL 0 UR6 27IN ABS (SUTURE) ×4 IMPLANT
SYRINGE 20CC LL (MISCELLANEOUS) ×4 IMPLANT
TOWEL OR 17X26 10 PK STRL BLUE (TOWEL DISPOSABLE) ×4 IMPLANT
TOWEL OR NON WOVEN STRL DISP B (DISPOSABLE) ×4 IMPLANT
TRAY FOLEY CATH 14FRSI W/METER (CATHETERS) ×4 IMPLANT
TROCAR ADV FIXATION 12X100MM (TROCAR) ×4 IMPLANT
TROCAR ADV FIXATION 5X100MM (TROCAR) ×4 IMPLANT
TROCAR BLADELESS 15MM (ENDOMECHANICALS) ×4 IMPLANT
TROCAR BLADELESS OPT 5 100 (ENDOMECHANICALS) ×4 IMPLANT
TROCAR XCEL 12X100 BLDLESS (ENDOMECHANICALS) IMPLANT
TUBING CONNECTING 10 (TUBING) ×3 IMPLANT
TUBING CONNECTING 10' (TUBING) ×1
TUBING ENDO SMARTCAP PENTAX (MISCELLANEOUS) ×4 IMPLANT
TUBING FILTER THERMOFLATOR (ELECTROSURGICAL) ×4 IMPLANT

## 2013-10-17 NOTE — H&P (View-Only) (Signed)
Re:   Alison Burch DOB:   01/23/1966 MRN:   [id number]  ASSESSMENT AND PLAN: 1.  Morbid obesity   Weight 286, BMI 46.3  Per the [facility], the patient is a candidate for bariatric surgery.  The patient attended our initial information session and reviewed the types of bariatric surgery.    The patient is interested in the Sleeve Gastrectomy.  I discussed with the patient the indications and risks of bariatric surgery.  The potential risks of surgery include, but are not limited to, bleeding, infection, leak from the bowel, DVT and PE, open surgery, long term nutrition consequences, and death.  The patient understands the importance of compliance and long term follow-up with our group after surgery.  She is for surgery 10/17/2013.  Her sister, Alison Burch, acts as her POA.  I gave her a prescription for Oxycodone elixir - 200 cc.   2.  HTN x 1 year 3.  Anxiety/Depression 4.  Bilateral knee surgery for chronic dislocation.  She still has some trouble 5.  Asymptomatic gallstones - I gave her a copy of the [place] report  Chief Complaint  Patient presents with  . Bariatric Follow Up   REFERRING PHYSICIAN: SLATOSKY,Alison J., MD  HISTORY OF PRESENT ILLNESS: Alison Burch is a 48 y.o. (DOB: 17-Jul-1965)  white  female whose primary care physician is Burch,Alison J., MD and comes to me today for weight loss.  Comes by herself.  She still has not met someone who has had a sleeve gastrectomy, but she feels that she knows the surgery well.  I reviewed all her tests with her and gave her copies of her [place] report.  She ask about cirrhosis of the liver.  Apparently her mother has cirrhosis.  She has some steatosis on the [place], but this is reversible with weight loss.  She has lost 23 pounds since I saw her last time.  This is a good sign that she is plugged into the surgery and its effects. UGI - 08/01/2013 - Normal US abdomen - 08/01/2013 -  Gallstones (copy to patient) She saw Dr.  Ardath Burch for psych - 08/26/2013 - he thought she was a good candidate  History of weight loss: Patient describes that the patient has been interested in weight loss surgery for some time. She went to an information session several years ago. She went a more recent information session presented by Dr. Redmond Burch. She is interested in a sleeve gastrectomy. She is not know anyone who has a sleeve gastrectomy. Her main weight loss has been to watch her calories. She has tried Atkins in the past. She had successful weight loss of about 46 pounds with Adipex and 2012 and but when she quit the Adipex, she regained her weight.  She has not chronic GI problems. She has had no prior abdominal surgery.   Past Medical History  Diagnosis Date  . Hyperlipidemia   . Hypertension   . Anxiety   . Depression      Past Surgical History  Procedure Laterality Date  . Knee reconstruction      knee cap  . Breath tek h pylori N/A 08/07/2013    Procedure: BREATH TEK H PYLORI;  Surgeon: Alison Medal, MD;  Location: [facility];  Service: General;  Laterality: N/A;     Current Outpatient Prescriptions  Medication Sig Dispense Refill  . Acetaminophen-Aspirin Buffered (EXCEDRIN BACK & BODY PO) Take 1 tablet by mouth every 8 (eight) hours as needed (for  pain).      . ALPRAZolam (XANAX) 0.25 MG tablet Take 0.25 mg by mouth at bedtime as needed for sleep.       . citalopram (CELEXA) 40 MG tablet Take 40 mg by mouth every morning.       . fenofibrate (TRICOR) 48 MG tablet Take 48 mg by mouth daily.       . ibuprofen (ADVIL,MOTRIN) 200 MG tablet Take 800 mg by mouth every 6 (six) hours as needed for mild pain or moderate pain.      . lisinopril (PRINIVIL,ZESTRIL) 10 MG tablet Take 10 mg by mouth every morning.       . Melatonin 10 MG TABS Take 10 mg by mouth at bedtime as needed (for sleep).       . metoprolol succinate (TOPROL-XL) 100 MG 24 hr tablet Take 100 mg by mouth every morning.        No current  facility-administered medications for this visit.     No Known Allergies  REVIEW OF SYSTEMS: Skin:  No history of rash.  No history of abnormal moles. Infection:  No history of hepatitis or HIV.  No history of MRSA. Neurologic:  No history of stroke.  No history of seizure.  No history of headaches. Cardiac:  Hypertension x 1 year.  Pulmonary:  Does not smoke cigarettes.  No asthma or bronchitis.  No OSA/CPAP.  Endocrine:  No diabetes. No thyroid disease. Gastrointestinal:  See HPI. No history of stomach disease.  No history of liver disease.  No history of gall bladder disease.  No history of pancreas disease.  No history of colon disease. Urologic:  No history of kidney stones.  No history of bladder infections. GYN:  Last period about 2 weeks ago.  She has no children. Musculoskeletal:  She has had bilateral knee surgery for chronic dislocation.  She still has some trouble with dislocation. Hematologic:  No bleeding disorder.  No history of anemia.  Not anticoagulated. Psycho-social:  She saw Alison Burch.  History of depression and anxiety.  SOCIAL and FAMILY HISTORY: Unmarried. No children. She takes care of her father who has Alzheimer's x 6 years.  She has no other job. She has 2 sisters and one brother.  They rotate taking care of their father on the weekends. Her sister, Alison Burch, acts as her POA.  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  Her husband has had colon issues and Alison has had to miss work to take care of her.  So it sounds like no one will be with [name] the day of surgery.  PHYSICAL EXAM: BP 134/70  Pulse 76  Temp(Src) 98.1 F (36.7 C)  Ht 5' 6" (1.676 m)  Wt 263 lb (119.296 kg)  BMI 42.47 kg/m2  General: Obese WF who is alert and generally healthy appearing.  HEENT: Normal. Pupils equal. Neck: Supple. No mass.  No thyroid mass. Lymph Nodes:  No supraclavicular or cervical nodes. Lungs: Clear to auscultation and symmetric breath  sounds. Heart:  RRR. No murmur or rub.  Abdomen: Soft. No mass. No tenderness. No hernia. Normal bowel sounds.  No abdominal scars.  She is about half apple and half pear. Extremities:  Good strength and ROM  in upper and lower extremities. Neurologic:  Grossly intact to motor and sensory function. Psychiatric: Has normal mood and affect. Behavior is normal.   DATA REVIEWED: Not much in Epic  David Newman, MD,  FACS [facility] [address]   [address]  [place], [place]    [place] Phone:  [phone] FAX:  [phone]

## 2013-10-17 NOTE — Transfer of Care (Signed)
Immediate Anesthesia Transfer of Care Note  Patient: Alison Burch  Procedure(s) Performed: Procedure(s) (LRB): LAPAROSCOPIC GASTRIC SLEEVE RESECTION (N/A) UPPER GI ENDOSCOPY  Patient Location: PACU  Anesthesia Type: General  Level of Consciousness: sedated, patient cooperative and responds to stimulation  Airway & Oxygen Therapy: Patient Spontanous Breathing and Patient connected to face mask oxgen  Post-op Assessment: Report given to PACU RN and Post -op Vital signs reviewed and stable  Post vital signs: Reviewed and stable  Complications: No apparent anesthesia complications

## 2013-10-17 NOTE — Interval H&P Note (Signed)
History and Physical Interval Note:  10/17/2013 9:51 AM  Alison Burch  has presented today for surgery, with the diagnosis of morbid obesity   The various methods of treatment have been discussed with the patient and family.  No family here right now - she thinks that her sister will come up later today.  After consideration of risks, benefits and other options for treatment, the patient has consented to  Procedure(s): LAPAROSCOPIC GASTRIC SLEEVE RESECTION (N/A) as a surgical intervention .  The patient's history has been reviewed, patient examined, no change in status, stable for surgery.  I have reviewed the patient's chart and labs.  Questions were answered to the patient's satisfaction.     Saleen Peden H

## 2013-10-17 NOTE — Anesthesia Preprocedure Evaluation (Signed)
Anesthesia Evaluation  Patient identified by MRN, date of birth, ID band Patient awake    Reviewed: Allergy & Precautions, H&P , NPO status , Patient's Chart, lab work & pertinent test results  Airway Mallampati: II TM Distance: >3 FB Neck ROM: Full    Dental  (+) Dental Advisory Given   Pulmonary neg pulmonary ROS,  breath sounds clear to auscultation        Cardiovascular hypertension, Pt. on medications Rhythm:Regular Rate:Normal     Neuro/Psych  Headaches, PSYCHIATRIC DISORDERS Anxiety Depression    GI/Hepatic negative GI ROS, Neg liver ROS,   Endo/Other  negative endocrine ROS  Renal/GU negative Renal ROS     Musculoskeletal negative musculoskeletal ROS (+)   Abdominal   Peds  Hematology negative hematology ROS (+)   Anesthesia Other Findings   Reproductive/Obstetrics                           Anesthesia Physical Anesthesia Plan  ASA: III  Anesthesia Plan: General   Post-op Pain Management:    Induction: Intravenous  Airway Management Planned: Oral ETT  Additional Equipment:   Intra-op Plan:   Post-operative Plan: Extubation in OR  Informed Consent: I have reviewed the patients History and Physical, chart, labs and discussed the procedure including the risks, benefits and alternatives for the proposed anesthesia with the patient or authorized representative who has indicated his/her understanding and acceptance.   Dental advisory given  Plan Discussed with: CRNA  Anesthesia Plan Comments:         Anesthesia Quick Evaluation

## 2013-10-17 NOTE — Anesthesia Postprocedure Evaluation (Signed)
Anesthesia Post Note  Patient: Chief Technology OfficerMelissa Feher  Procedure(s) Performed: Procedure(s) (LRB): LAPAROSCOPIC GASTRIC SLEEVE RESECTION (N/A) UPPER GI ENDOSCOPY  Anesthesia type: General  Patient location: PACU  Post pain: Pain level controlled  Post assessment: Post-op Vital signs reviewed  Last Vitals: BP 130/68  Pulse 80  Temp(Src) 36.6 C (Oral)  Resp 15  Ht 5\' 6"  (1.676 m)  Wt 261 lb (118.389 kg)  BMI 42.15 kg/m2  SpO2 100%  LMP 10/13/2013  Post vital signs: Reviewed  Level of consciousness: sedated  Complications: No apparent anesthesia complications

## 2013-10-17 NOTE — Op Note (Signed)
PATIENT:   Alison Burch DOB:   [birth year] MRN:   [id number]  DATE OF PROCEDURE: 10/17/2013                   [facility]  OPERATIVE REPORT  PREOPERATIVE DIAGNOSIS:  Morbid obesity.  POSTOPERATIVE DIAGNOSIS:  Morbid obesity (weight 286, BMI of 46.3).  PROCEDURE:  Laparoscopic Sleeve gastrectomy (intraoperative upper endoscopy by Dr. Sheron NightingaleM. Martin)  SURGEON:  Sandria Balesavid H. Ezzard StandingNewman, MD  FIRST ASSISTANT:  Sheron NightingaleM. Martin, MD  ANESTHESIA:  General endotracheal.  Anesthesiologist: Gaylan GeroldJohn R Germeroth, MD CRNA: Epimenio SarinJoshua R Jarvela, CRNA; Orest DikesLaura J Peters, CRNA  General  ESTIMATED BLOOD LOSS:  Minimal.  LOCAL ANESTHESIA:  30 cc of 1/4% Marcaine  COMPLICATIONS:  None.  INDICATION FOR SURGERY:  Alison Burch is a 48 y.o. white  female who sees SLATOSKY,JOHN J., MD as her primary care doctor.  She has completed our preoperative bariatric program and now comes for a laparoscopic Sleeve gastrectomy.  The indications, potential complications of surgery were explained to the patient.  Potential complications of the surgery include, but are not limited to, bleeding, infection, DVT, open surgery, and long-term nutritional consequences.  OPERATIVE NOTE:  The patient taken [facility] where Ms. Kolodny underwent a general endotracheal anesthetic, supervised by Anesthesiologist: Gaylan GeroldJohn R Germeroth, MD CRNA: Epimenio SarinJoshua R Jarvela, CRNA; Orest DikesLaura J Peters, CRNA.  The patient was given 2 g of cefoxitin at the beginning of the procedure.  A time-out was held and surgical checklist run.  I accessed her abdominal cavity through the left upper quadrant with a 12 mm Optiview. I did an abdominal exploration.   Her omentum and bowel were unremarkable. The right and left lobes of the liver unremarkable. Gallbladder was unremarkable, though she has known gall stones. Her stomach was unremarkable.   I placed a total of 6 trocars. I placed a 5 mm left lateral trocar, a 12 mm left paramedian trocar, a 12 mm right paramedian torcar, a 5  mm right subcostal trocar that I converted to a 15 mm to extract the stomach and 5 mm subxiphoid trocar for the liver retractor.  I started out taking down the greater curvature attachment. I measured approximately 6 cm proximal from the pylorus and I started my dissection taking down the greater curvature of the stomach with the Harmonic Scalpel at that point. I took this dissection around her stomach to the angle of His and the left crus.   After I had mobilized the greater curvature of the stomach, I then passed the 36 French ViSiGi bougie which was used to suck up against the lesser curvature and placed into the antrum. During the staple firing,  I tried to give the ViSiGi a cuff at least about 1 cm. I tried to avoid narrowing the incisura. I used a total of 7 staple firings.  From antrum to the angle of His I used 2 green, 1 gold and 4 blue Eschelon 60 mm Ethicon staplers.  At each firing of the EndoGIA stapler, I inspected the stomach, anterior wall of the stomach, and underneath to make sure there was no compromise or impingement on to the ViSiGi bougie.   The staple line seemed linear without any corkscrewing of the stomach. Hemostasis was good. I did not use any reinforcement. She did have at least 1 areas of bleeding which I used clips to clip on the new greater curvature of the stomach.  This was at the distal end of the staple  line.  Because I thought we had a good staple line, I then had the ViSiGi was converted to insufflate the pouch. A new stomach pouch was placed under water. There was no bubbling or leak noted.   At this point, Dr. Daphine DeutscherMartin broke scrub and passed an upper endoscope down through the esophagus into the stomach pouch. The stomach was tubular. There was no narrowing of the stomach pouch or angulation. We were easily able to pass the endoscope into the antrum and again put air pressure on the staple line. I inflated the upper abdomen with saline. There was no bubbling or  evidence of air leak. The mucosa looked viable.  I converted to right subcostal trocar to a 15 trocar and extracted the stomach remnant through this intact and sent this to Pathology. I then placed 10 cc ofTisseel along the new greater curvature staple line, covered the entire staple line with the Tisseel, and aspirated out the saline that I had irrigated because I thought the staple line looked viable and complete. There was no evidence of leak. I did not leave a drain in place. Dr. Daphine DeutscherMartin decompressed the stomach with the endoscopy.   Then, I closed the trocar sites. I placed 2-0 Vicryl sutures at the 15-mm port site in the right upper quadrant. The other port sites seemed smaller not requiring sutures. I closed the skin at each site with a 4-0 Monocryl, painted each wound with Dermabond. The patient was transported to recovery room in good condition. Sponge and needle count were correct at the end of the case.   Alison Kinavid Newman, MD, [facility] Pager: [phone] Office phone:  [phone]

## 2013-10-18 ENCOUNTER — Encounter (HOSPITAL_COMMUNITY): Payer: Self-pay | Admitting: Surgery

## 2013-10-18 ENCOUNTER — Inpatient Hospital Stay (HOSPITAL_COMMUNITY): Payer: BC Managed Care – PPO

## 2013-10-18 DIAGNOSIS — Z09 Encounter for follow-up examination after completed treatment for conditions other than malignant neoplasm: Secondary | ICD-10-CM

## 2013-10-18 LAB — CBC WITH DIFFERENTIAL/PLATELET
BASOS ABS: 0 10*3/uL (ref 0.0–0.1)
Basophils Relative: 0 % (ref 0–1)
EOS PCT: 0 % (ref 0–5)
Eosinophils Absolute: 0 10*3/uL (ref 0.0–0.7)
HEMATOCRIT: 37.4 % (ref 36.0–46.0)
Hemoglobin: 12.7 g/dL (ref 12.0–15.0)
LYMPHS ABS: 1.2 10*3/uL (ref 0.7–4.0)
Lymphocytes Relative: 8 % — ABNORMAL LOW (ref 12–46)
MCH: 28.6 pg (ref 26.0–34.0)
MCHC: 34 g/dL (ref 30.0–36.0)
MCV: 84.2 fL (ref 78.0–100.0)
MONO ABS: 0.7 10*3/uL (ref 0.1–1.0)
Monocytes Relative: 5 % (ref 3–12)
NEUTROS ABS: 13.1 10*3/uL — AB (ref 1.7–7.7)
Neutrophils Relative %: 87 % — ABNORMAL HIGH (ref 43–77)
Platelets: 358 10*3/uL (ref 150–400)
RBC: 4.44 MIL/uL (ref 3.87–5.11)
RDW: 13 % (ref 11.5–15.5)
WBC: 15 10*3/uL — AB (ref 4.0–10.5)

## 2013-10-18 LAB — HEMOGLOBIN AND HEMATOCRIT, BLOOD
HCT: 37.3 % (ref 36.0–46.0)
Hemoglobin: 12.2 g/dL (ref 12.0–15.0)

## 2013-10-18 MED ORDER — IOHEXOL 300 MG/ML  SOLN
50.0000 mL | Freq: Once | INTRAMUSCULAR | Status: AC | PRN
  Administered 2013-10-18: 50 mL via ORAL

## 2013-10-18 NOTE — Progress Notes (Signed)
Patient alert and oriented, pain is controlled. Patient is tolerating fluids, plan to advance to protein shake tomorrow.  Reviewed Gastric sleeve discharge instructions with patient and patient is able to articulate understanding.  Provided information on BELT program, Support Group and [facility] outpatient pharmacy. All questions answered, will continue to monitor. Patient experiencing some nausea not associated with drinking, possibly related to IV pain medication, recommended trying PO pain medication for next trial to see if nausea resolves.

## 2013-10-18 NOTE — Progress Notes (Signed)
Bilateral lower extremity venous duplex:  No evidence of DVT, superficial thrombosis, or Baker's cyst.   

## 2013-10-18 NOTE — Progress Notes (Signed)
Patient alert and oriented, Post op day 1.  Provided support and encouragement.  Encouraged pulmonary toilet, ambulation and Gray sips of liquids.  All questions answered.  Will continue to monitor. 

## 2013-10-18 NOTE — Progress Notes (Signed)
Nutrition Education Note  Received consult for diet education per DROP protocol.   Discussed 2 week post op diet with pt. Emphasized that liquids must be non carbonated, non caffeinated, and sugar free. Fluid goals discussed. Pt to follow up with outpatient bariatric RD for further diet progression after 2 weeks. Multivitamins and minerals also reviewed. Teach back method used, pt expressed understanding, expect good compliance. Having some pain and nausea, notified RN. Answered all of pt's diet questions.    Diet: First 2 Weeks  You will see the nutritionist about two (2) weeks after your surgery. The nutritionist will increase the types of foods you can eat if you are handling liquids well:  If you have severe vomiting or nausea and cannot handle clear liquids lasting longer than 1 day, call your surgeon  Protein Shake  Drink at least 2 ounces of shake 5-6 times per day  Each serving of protein shakes (usually 8 - 12 ounces) should have a minimum of:  15 grams of protein  And no more than 5 grams of carbohydrate  Goal for protein each day:  Men = 80 grams per day  Women = 60 grams per day  Protein powder may be added to fluids such as non-fat milk or Lactaid milk or Soy milk (limit to 35 grams added protein powder per serving)   Hydration  Slowly increase the amount of water and other clear liquids as tolerated (See Acceptable Fluids)  Slowly increase the amount of protein shake as tolerated  Sip fluids slowly and throughout the day  May use sugar substitutes in Starkel amounts (no more than 6 - 8 packets per day; i.e. Splenda)   Fluid Goal  The first goal is to drink at least 8 ounces of protein shake/drink per day (or as directed by the nutritionist); some examples of protein shakes are [employer]Syntrax Nectar, Dillard'sdkins Advantage, EAS Edge HP, and Unjury. See handout from pre-op Bariatric Education Class:  Slowly increase the amount of protein shake you drink as tolerated  You may find it easier  to slowly sip shakes throughout the day  It is important to get your proteins in first  Your fluid goal is to drink 64 - 100 ounces of fluid daily  It may take a few weeks to build up to this  32 oz (or more) should be clear liquids  And  32 oz (or more) should be full liquids (see below for examples)  Liquids should not contain sugar, caffeine, or carbonation   Clear Liquids:  Water or Sugar-free flavored water (i.e. Fruit H2O, Propel)  Decaffeinated coffee or tea (sugar-free)  Crystal Lite, Wyler's Lite, Minute Maid Lite  Sugar-free Jell-O  Bouillon or broth  Sugar-free Popsicle: *Less than 20 calories each; Limit 1 per day   Full Liquids:  Protein Shakes/Drinks + 2 choices per day of other full liquids  Full liquids must be:  No More Than 12 grams of Carbs per serving  No More Than 3 grams of Fat per serving  Strained low-fat cream soup  Non-Fat milk  Fat-free Lactaid Milk  Sugar-free yogurt (Dannon Lite & Fit, Greek yogurt)     CentralHeather Alonnie Bieker MS, RD, [place] [phone] Pager [phone] Weekend/After Hours Pager

## 2013-10-18 NOTE — Progress Notes (Signed)
Utilization review completed.  

## 2013-10-18 NOTE — Progress Notes (Signed)
General Surgery Note  LOS: 1 day  POD -  1 Day Post-Op  Assessment/Plan: 1.  LAPAROSCOPIC GASTRIC SLEEVE RESECTION, UPPER GI ENDOSCOPY - 10/17/2013 - D. Amel Kitch  For UGI swallow today  2. HTN x 1 year  3. Anxiety/Depression  4. Bilateral knee surgery for chronic dislocation.   She still has some trouble  5. Asymptomatic gallstones  6. DVT prophylaxis - SQ Heparin    Active Problems:   Morbid obesity with body mass index of 40.0-44.9 in adult  Subjective:  Sore and nauseated.  Sorest spot on abdomen is stomach extraction site.   Also, she does not like the bed.  She is sitting a chair.  Her family did not make it in yesterday to see her. Objective:   Filed Vitals:   10/18/13 0630  BP: 134/76  Pulse: 73  Temp: 97.8 F (36.6 C)  Resp: 16     Intake/Output from previous day:  06/30 0701 - 07/01 0700 In: 4057.5 [I.V.:4057.5] Out: 2125 [Urine:2050; Blood:75]  Intake/Output this shift:      Physical Exam:   General: Obese WF who is alert and oriented.    HEENT: Normal. Pupils equal. .   Lungs: Clear.  IS at 1,700 cc.   Abdomen: Soft.   Wound: Clean.   Lab Results:    Recent Labs  10/17/13 1649 10/18/13 0513  WBC  --  15.0*  HGB 13.0 12.7  HCT 38.8 37.4  PLT  --  358    BMET  No results found for this basename: NA, K, CL, CO2, GLUCOSE, BUN, CREATININE, CALCIUM,  in the last 72 hours  PT/INR  No results found for this basename: LABPROT, INR,  in the last 72 hours  ABG  No results found for this basename: PHART, PCO2, PO2, HCO3,  in the last 72 hours   Studies/Results:  No results found.   Anti-infectives:   Anti-infectives   Start     Dose/Rate Route Frequency Ordered Stop   10/17/13 1800  cefOXitin (MEFOXIN) 2 g in dextrose 5 % 50 mL IVPB     2 g 100 mL/hr over 30 Minutes Intravenous 4 times per day 10/17/13 1619 10/17/13 1804   10/17/13 0739  cefOXitin (MEFOXIN) 2 g in dextrose 5 % 50 mL IVPB     2 g 100 mL/hr over 30 Minutes Intravenous On call to  O.R. 10/17/13 0739 10/17/13 1027      Ovidio Kinavid Khalilah Hoke, MD, FACS Pager: [phone] [facility] Office: [phone] 10/18/2013

## 2013-10-18 NOTE — Discharge Instructions (Addendum)
GASTRIC BYPASS/SLEEVE  Home Care Instructions   These instructions are to help you care for yourself when you go home.  Call: If you have any problems.   Call [phone] and ask for the surgeon on call   If you need immediate assistance come to the ER at [facility]. Tell the ER staff you are a new post-op gastric bypass or gastric sleeve patient  Signs and symptoms to report:   Severe  vomiting or nausea o If you cannot handle clear liquids for longer than 1 day, call your surgeon   Abdominal pain which does not get better after taking your pain medication   Fever greater than 100.4  F and chills   Heart rate over 100 beats a minute   Trouble breathing   Chest pain   Redness,  swelling, drainage, or foul odor at incision (surgical) sites   If your incisions open or pull apart   Swelling or pain in calf (lower leg)   Diarrhea (Loose bowel movements that happen often), frequent watery, uncontrolled bowel movements   Constipation, (no bowel movements for 3 days) if this happens: o Take Milk of Magnesia, 2 tablespoons by mouth, 3 times a day for 2 days if needed o Stop taking Milk of Magnesia once you have had a bowel movement o Call your doctor if constipation continues Or o Take Miralax  (instead of Milk of Magnesia) following the label instructions o Stop taking Miralax once you have had a bowel movement o Call your doctor if constipation continues   Anything you think is abnormal for you   Normal side effects after surgery:   Unable to sleep at night or unable to concentrate   Irritability   Being tearful (crying) or depressed  These are common complaints, possibly related to your anesthesia, stress of surgery, and change in lifestyle, that usually go away a few weeks after surgery. If these feelings continue, call your medical doctor.  Wound Care: You may have surgical glue, steri-strips, or staples over your incisions after surgery   Surgical glue: Looks like clear  film over your incisions and will wear off a little at a time   Steri-strips: Adhesive strips of tape over your incisions. You may notice a yellowish color on skin under the steri-strips. This is used to make the steri-strips stick better. Do not pull the steri-strips off - let them fall off   Staples: Staples may be removed before you leave the hospital o If you go home with staples, call [facility] for an appointment with your surgeons nurse to have staples removed 10 days after surgery, [phone]   Showering: You may shower two (2) days after your surgery unless your surgeon tells you differently o Wash gently around incisions with warm soapy water, rinse well, and gently pat dry o If you have a drain (tube from your incision), you may need someone to hold this while you shower o No tub baths until staples are removed and incisions are healed   Medications:   Medications should be liquid or crushed if larger than the size of a dime   Extended release pills (medication that releases a little bit at a time through the  day) should not be crushed   Depending on the size and number of medications you take, you may need to space (take a few throughout the day)/change the time you take your medications so that you do not over-fill your pouch (smaller  stomach)   Make sure you follow-up with you primary care physician to make medication changes needed during rapid weight loss and life -style changes   If you have diabetes, follow up with your doctor that orders your diabetes medication(s) within one week after surgery and check your blood sugar regularly    Do not drive while taking narcotics (pain medications)    Do not take acetaminophen (Tylenol) and Roxicet or Lortab Elixir at the same time since these pain medications contain acetaminophen   Diet:  First 2 Weeks You will see the nutritionist about two (2) weeks after your surgery. The nutritionist will increase the types of  foods you can eat if you are handling liquids well:   If you have severe vomiting or nausea and cannot handle clear liquids lasting longer than 1 day call your surgeon Protein Shake   Drink at least 2 ounces of shake 5-6 times per day   Each serving of protein shakes (usually 8-12 ounces) should have a minimum of: o 15 grams of protein o And no more than 5 grams of carbohydrate   Goal for protein each day: o Men = 80 grams per day o Women = 60 grams per day      Protein powder may be added to fluids such as non-fat milk or Lactaid milk or Soy milk (limit to 35 grams added protein powder per serving)  Hydration   Slowly increase the amount of water and other clear liquids as tolerated (See Acceptable Fluids)   Slowly increase the amount of protein shake as tolerated   Sip fluids slowly and throughout the day   May use sugar substitutes in Bach amounts (no more than 6-8 packets per day; i.e. Splenda)  Fluid Goal   The first goal is to drink at least 8 ounces of protein shake/drink per day (or as directed by the nutritionist); some examples of protein shakes are [employer], [employer]R [employer], EAS Edge HP, and Unjury. - See handout from pre-op Bariatric Education Class: o Slowly increase the amount of protein shake you drink as tolerated o You may find it easier to slowly sip shakes throughout the day o It is important to get your proteins in first   Your fluid goal is to drink 64-100 ounces of fluid daily o It may take a few weeks to build up to this    32 oz. (or more) should be clear liquids And   32 oz. (or more) should be full liquids (see below for examples)   Liquids should not contain sugar, caffeine, or carbonation  Clear Liquids:   Water of Sugar-free flavored water (i.e. Fruit HO, Propel)   Decaffeinated coffee or tea (sugar-free)   Crystal lite, Wylers Lite, Minute Maid Lite   Sugar-free Jell-O   Bouillon or broth   Sugar-free Popsicle:    - Less than 20 calories  each; Limit 1 per day  Full Liquids:                   Protein Shakes/Drinks + 2 choices per day of other full liquids   Full liquids must be: o No More Than 12 grams of Carbs per serving o No More Than 3 grams of Fat per serving   Strained low-fat cream soup   Non-Fat milk   Fat-free Lactaid Milk   Sugar-free yogurt (Dannon Lite & Fit, Greek yogurt)    Vitamins and Minerals   Start 1 day after surgery unless otherwise directed by  your surgeon   2 Chewable Multivitamin / Multimineral Supplement with iron (i.e. Centrum for Adults)   Vitamin B-12, 350-500 micrograms sub-lingual (place tablet under the tongue) each day   Chewable Calcium Citrate with Vitamin D-3 (Example: 3 Chewable Calcium  Plus 600 with Vitamin D-3) o Take 500 mg three (3) times a day for a total of 1500 mg each day o Do not take all 3 doses of calcium at one time as it may cause constipation, and you can only absorb 500 mg at a time o Do not mix multivitamins containing iron with calcium supplements;  take 2 hours apart o Do not substitute Tums (calcium carbonate) for your calcium   Menstruating women and those at risk for anemia ( a blood disease that causes weakness) may need extra iron o Talk to your doctor to see if you need more iron   If you need extra iron: Total daily Iron recommendation (including Vitamins) is 50 to 100 mg Iron/day   Do not stop taking or change any vitamins or minerals until you talk to your nutritionist or surgeon   Your nutritionist and/or surgeon must approve all vitamin and mineral supplements   Activity and Exercise: It is important to continue walking at home. Limit your physical activity as instructed by your doctor. During this time, use these guidelines:   Do not lift anything greater than ten  (10) pounds for at least two (2) weeks   Do not go back to work or drive until Engineer, production says you can   You may have sex when you feel comfortable o It is VERY important for female  patients to use a reliable birth control method; fertility often increase after surgery o Do not get pregnant for at least 18 months   Start exercising as soon as your doctor tells you that you can o Make sure your doctor approves any physical activity   Start with a simple walking program   Walk 5-15 minutes each day, 7 days per week   Slowly increase until you are walking 30-45 minutes per day   Consider joining our [place] program. [phone] or email [email or web address]   Special Instructions Things to remember:   Free counseling is available for you and your family through collaboration between [facility] and [place]. Please call [phone] and leave a message   Use your CPAP when sleeping if this applies to you   Consider buying a medical alert bracelet that says you had lap-band surgery     You will likely have your first fill (fluid added to your band) 6 - 8 weeks after surgery   [facility] has a free Bariatric Surgery Support Group that meets monthly, the 3rd Thursday, [place]. You can see classes online at [email or web address]   It is very important to keep all follow up appointments with your surgeon, nutritionist, primary care physician, and behavioral health practitioner o After the first year, please follow up with your bariatric surgeon and nutritionist at least once a year in order to maintain best weight loss results                    [facility]:  [facility]: [phone]               Bariatric Nurse Coordinator: [phone]  Gastric Bypass/Sleeve  Home Care Instructions  Rev. 05/2012                                                         Reviewed and Endorsed                                                    by [facility] Patient Education Committee, Jan, 2014    [facility] - DISCHARGE INSTRUCTIONS TO  PATIENT  Activity:  Driving - May drive in 3 or 4 days, if doing well   Lifting - No lifting more than 15 pounds for 1 week, then no limit  Wound Care:   May shower  Diet:  Post gastric sleeve diet  Follow up appointment:  Call Dr. Allene PyoNewman's office [facility]) at [phone] for an appointment in 2 weeks.  Medications and dosages:  Resume your home medications.  You have a prescription for:  Oxycodone elixir.  Call Dr. Ezzard StandingNewman or his office  [phone]) if you have:  Temperature greater than 100.4,  Persistent nausea and vomiting,  Severe uncontrolled pain,  Redness, tenderness, or signs of infection (pain, swelling, redness, odor or green/yellow discharge around the site),  Difficulty breathing, headache or visual disturbances,  Any other questions or concerns you may have after discharge.  In an emergency, call 911 or go to an Emergency Department at a nearby hospital.

## 2013-10-19 LAB — CBC WITH DIFFERENTIAL/PLATELET
BASOS PCT: 0 % (ref 0–1)
Basophils Absolute: 0 10*3/uL (ref 0.0–0.1)
EOS ABS: 0 10*3/uL (ref 0.0–0.7)
EOS PCT: 0 % (ref 0–5)
HEMATOCRIT: 34.5 % — AB (ref 36.0–46.0)
Hemoglobin: 11.7 g/dL — ABNORMAL LOW (ref 12.0–15.0)
LYMPHS PCT: 14 % (ref 12–46)
Lymphs Abs: 1.5 10*3/uL (ref 0.7–4.0)
MCH: 28.6 pg (ref 26.0–34.0)
MCHC: 33.9 g/dL (ref 30.0–36.0)
MCV: 84.4 fL (ref 78.0–100.0)
MONO ABS: 0.8 10*3/uL (ref 0.1–1.0)
Monocytes Relative: 7 % (ref 3–12)
Neutro Abs: 8.4 10*3/uL — ABNORMAL HIGH (ref 1.7–7.7)
Neutrophils Relative %: 79 % — ABNORMAL HIGH (ref 43–77)
Platelets: 262 10*3/uL (ref 150–400)
RBC: 4.09 MIL/uL (ref 3.87–5.11)
RDW: 13.3 % (ref 11.5–15.5)
WBC: 10.7 10*3/uL — ABNORMAL HIGH (ref 4.0–10.5)

## 2013-10-19 NOTE — Progress Notes (Signed)
Patient alert and oriented, Post op day 2.  Provided support and encouragement.  Encouraged pulmonary toilet, ambulation and [place] sips of liquids.  All questions answered.  Will continue to monitor. 

## 2013-10-19 NOTE — Discharge Summary (Signed)
Physician Discharge Summary  Patient ID:  Alison Burch  MRN: [id number]  DOB/AGE: 50-Sep-1967 50 y.o.  Admit date: 10/17/2013 Discharge date: 10/19/2013  Discharge Diagnoses:  1. Morbid obesity   Weight 286, BMI 46.3   2. HTN x 1 year  3. Anxiety/Depression  4. Bilateral knee surgery for chronic dislocation.   She still has some trouble  5. Asymptomatic gallstones - I gave her a copy of the US report   Active Problems:   Morbid obesity with body mass index of 40.0-44.9 in adult  Operation: Procedure(s): LAPAROSCOPIC GASTRIC SLEEVE RESECTION,  UPPER GI ENDOSCOPY on 10/17/2013 Alison Burch- Alison Burch  Discharged Condition: good  Hospital Course: Alison GaribaldiMelissa Burch is an 48 y.o. female whose primary care physician is Alison DoneSLATOSKY,JOHN J., MD and who was admitted 10/17/2013 with a chief complaint of morbid obesity.   She was brought to the operating room on 10/17/2013 and underwent  Sleeve gastrectomy.   She had an UGI swallow the first post op day which was normal.  Because of a power outage, the films were lost, but Dr. Margo AyeHall has a report. She was started on water and tolerated this well.  She had some nausea early, but that has improved.  She is now ready to go home.   Of note, no family member has visited her in the hospital.  The discharge instructions were reviewed with the patient.  Consults: None  Significant Diagnostic Studies: Results for orders placed during the hospital encounter of 10/17/13  HEMOGLOBIN AND HEMATOCRIT, BLOOD      Result Value Ref Range   Hemoglobin 13.0  12.0 - 15.0 g/dL   HCT 13.238.8  44.036.0 - 10.246.0 %  CBC WITH DIFFERENTIAL      Result Value Ref Range   WBC 15.0 (*) 4.0 - 10.5 K/uL   RBC 4.44  3.87 - 5.11 MIL/uL   Hemoglobin 12.7  12.0 - 15.0 g/dL   HCT 72.537.4  36.636.0 - 44.046.0 %   MCV 84.2  78.0 - 100.0 fL   MCH 28.6  26.0 - 34.0 pg   MCHC 34.0  30.0 - 36.0 g/dL   RDW 34.713.0  42.511.5 - 95.615.5 %   Platelets 358  150 - 400 K/uL   Neutrophils Relative % 87 (*) 43 - 77 %   Neutro Abs 13.1  (*) 1.7 - 7.7 K/uL   Lymphocytes Relative 8 (*) 12 - 46 %   Lymphs Abs 1.2  0.7 - 4.0 K/uL   Monocytes Relative 5  3 - 12 %   Monocytes Absolute 0.7  0.1 - 1.0 K/uL   Eosinophils Relative 0  0 - 5 %   Eosinophils Absolute 0.0  0.0 - 0.7 K/uL   Basophils Relative 0  0 - 1 %   Basophils Absolute 0.0  0.0 - 0.1 K/uL  HEMOGLOBIN AND HEMATOCRIT, BLOOD      Result Value Ref Range   Hemoglobin 12.2  12.0 - 15.0 g/dL   HCT 38.737.3  56.436.0 - 33.246.0 %  CBC WITH DIFFERENTIAL      Result Value Ref Range   WBC 10.7 (*) 4.0 - 10.5 K/uL   RBC 4.09  3.87 - 5.11 MIL/uL   Hemoglobin 11.7 (*) 12.0 - 15.0 g/dL   HCT 95.134.5 (*) 88.436.0 - 16.646.0 %   MCV 84.4  78.0 - 100.0 fL   MCH 28.6  26.0 - 34.0 pg   MCHC 33.9  30.0 - 36.0 g/dL   RDW 06.313.3  01.611.5 - 01.015.5 %  Platelets 262  150 - 400 K/uL   Neutrophils Relative % 79 (*) 43 - 77 %   Neutro Abs 8.4 (*) 1.7 - 7.7 K/uL   Lymphocytes Relative 14  12 - 46 %   Lymphs Abs 1.5  0.7 - 4.0 K/uL   Monocytes Relative 7  3 - 12 %   Monocytes Absolute 0.8  0.1 - 1.0 K/uL   Eosinophils Relative 0  0 - 5 %   Eosinophils Absolute 0.0  0.0 - 0.7 K/uL   Basophils Relative 0  0 - 1 %   Basophils Absolute 0.0  0.0 - 0.1 K/uL    Dg Ugi W/water Sol Cm  10/18/2013   CLINICAL DATA:  48 year old female status post gastric sleeve. Postoperative evaluation. Initial encounter.  EXAM: WATER SOLUBLE UPPER GI SERIES  TECHNIQUE: Single-column upper GI series was performed using water soluble contrast.  CONTRAST:  50mL OMNIPAQUE IOHEXOL 300 MG/ML  SOLN  COMPARISON:  Preoperative upper GI series 414 2015.  FLUOROSCOPY TIME:  2 min  FINDINGS: Scout view of the abdomen demonstrating left upper quadrant staple line.  Fluoroscopy images were lost due to power outage and subsequent modality malfunction.  Fluoroscopy during p.o. water-soluble oral contrast demonstrated no obstruction of the forward flow of contrast to the distal esophagus, and into the stomach. No leak occurred. Towards the end of the exam  normal gastric emptying to the duodenum C loop was demonstrated.  IMPRESSION: No leak or adverse features status post gastric sleeve.  Fluoroscopy images were lost due to power outage and subsequent modality malfunction.   Electronically Signed   By: Augusto GambleLee  Hall M.Alison.   On: 10/18/2013 15:51   Discharge Exam:  Filed Vitals:   10/19/13 0541  BP: 117/71  Pulse: 74  Temp: 98 F (36.7 C)  Resp: 18    General: WN Obese WF who is alert and generally healthy appearing.  Lungs: Clear to auscultation and symmetric breath sounds. Heart:  RRR. No murmur or rub. Abdomen: Soft.  Normal bowel sounds.  Her incisions looks good.  Discharge Medications:     Medication List    STOP taking these medications       ibuprofen 200 MG tablet  Commonly known as:  ADVIL,MOTRIN      TAKE these medications       ALPRAZolam 0.25 MG tablet  Commonly known as:  XANAX  Take 0.25 mg by mouth at bedtime as needed for sleep.     citalopram 40 MG tablet  Commonly known as:  CELEXA  Take 40 mg by mouth every morning.     EXCEDRIN BACK & BODY PO  Take 1 tablet by mouth every 8 (eight) hours as needed (for pain).     fenofibrate 48 MG tablet  Commonly known as:  TRICOR  Take 48 mg by mouth daily.     lisinopril 10 MG tablet  Commonly known as:  PRINIVIL,ZESTRIL  Take 10 mg by mouth every morning.     Melatonin 10 MG Tabs  Take 10 mg by mouth at bedtime as needed (for sleep).     metoprolol succinate 100 MG 24 hr tablet  Commonly known as:  TOPROL-XL  Take 100 mg by mouth every morning.        Disposition: 01-Home or Self Care      Discharge Instructions   Increase activity slowly    Complete by:  As directed           Activity:  Driving -  May drive in 3 or 4 days, if doing well   Lifting - No lifting more than 15 pounds for 1 week, then no limit  Wound Care:   May shower  Diet:  Post gastric sleeve diet  Follow up appointment:  Call Dr. Allene Burch office [facility]) at  [phone] for an appointment in 2 weeks.  Medications and dosages:  Resume your home medications.  You have a prescription for:  Oxycodone elixir.  Signed: Ovidio Kin, M.Alison., [facility] Office:  [phone]  10/19/2013, 6:49 AM

## 2013-10-30 ENCOUNTER — Telehealth (HOSPITAL_COMMUNITY): Payer: Self-pay

## 2013-10-31 ENCOUNTER — Encounter: Payer: BC Managed Care – PPO | Attending: Surgery

## 2013-10-31 VITALS — Ht 66.0 in | Wt 244.0 lb

## 2013-10-31 DIAGNOSIS — Z713 Dietary counseling and surveillance: Secondary | ICD-10-CM | POA: Insufficient documentation

## 2013-10-31 DIAGNOSIS — Z6841 Body Mass Index (BMI) 40.0 and over, adult: Secondary | ICD-10-CM

## 2013-10-31 DIAGNOSIS — Z01818 Encounter for other preprocedural examination: Secondary | ICD-10-CM | POA: Insufficient documentation

## 2013-10-31 NOTE — Patient Instructions (Signed)
Patient to follow Phase 3A-Soft, High Protein Diet and follow-up at [facility] in 6 weeks for 2 months post-op nutrition visit for diet advancement. 

## 2013-10-31 NOTE — Progress Notes (Signed)
Bariatric Class:  Appt start time: 1530 end time:  1630.  2 Week Post-Operative Nutrition Class  Patient was seen on 10/31/2013 for Post-[facility]ative [facility]   Surgery date: 10/17/2013 Surgery type: Gastric sleeve Start weight at [facility]: 275 lbs on 08/21/2013 Weight today: 244.0 lbs  Weight loss: 21 lbs  TANITA  BODY COMP RESULTS  10/02/2013 10/31/13   BMI (kg/m^2) 42.9 39.4   Fat Mass (lbs) 118.5 125.0   Fat Free Mass (lbs) 147 119.0   Total Body Water (lbs) 107.5 87    The following the learning objectives were met by the patient during this course:  Identifies Phase 3A (Soft, High Proteins) Dietary Goals and will begin from 2 weeks post-operatively to 2 months post-operatively  Identifies appropriate sources of fluids and proteins   States protein recommendations and appropriate sources post-operatively  Identifies the need for appropriate texture modifications, mastication, and bite sizes when consuming solids  Identifies appropriate multivitamin and calcium sources post-operatively  Describes the need for physical activity post-operatively and will follow MD recommendations  States when to call healthcare provider regarding medication questions or post-operative complications  Handouts given during class include:  Phase 3A: Soft, High Protein Diet Handout  Follow-Up Plan: Patient will follow-up at [facility] in 6 weeks for 2 month post-op nutrition visit for diet advancement per MD.

## 2013-11-09 ENCOUNTER — Other Ambulatory Visit (INDEPENDENT_AMBULATORY_CARE_PROVIDER_SITE_OTHER): Payer: Self-pay

## 2013-11-09 ENCOUNTER — Encounter (INDEPENDENT_AMBULATORY_CARE_PROVIDER_SITE_OTHER): Payer: Self-pay | Admitting: Surgery

## 2013-11-09 ENCOUNTER — Ambulatory Visit (INDEPENDENT_AMBULATORY_CARE_PROVIDER_SITE_OTHER): Payer: BC Managed Care – PPO | Admitting: Surgery

## 2013-11-09 VITALS — BP 124/84 | HR 83 | Temp 98.0°F | Resp 18 | Ht 66.0 in | Wt 242.0 lb

## 2013-11-09 DIAGNOSIS — Z903 Acquired absence of stomach [part of]: Secondary | ICD-10-CM | POA: Insufficient documentation

## 2013-11-09 DIAGNOSIS — Z9889 Other specified postprocedural states: Secondary | ICD-10-CM

## 2013-11-09 NOTE — Progress Notes (Signed)
Re:   Alison Burch DOB:   27-Jan-1966 MRN:   [id number]  ASSESSMENT AND PLAN: 1.  Sleeve gastrectomy - 10/17/2013 - D. Serenitee Fuertes  For morbid obesity, Initial weight 286, Initial BMI 46.3  Doing well.  Down 40 pounds from her peak.  I'll see her back in 3 months.  Will do labs at the same time.   2.  HTN x 1 year 3.  Anxiety/Depression 4.  Bilateral knee surgery for chronic dislocation.  She still has some trouble 5.  Asymptomatic gallstones - I gave her a copy of the US report  Chief Complaint  Patient presents with  . Bariatric Follow Up    sleeve 10/17/13   REFERRING PHYSICIAN: SLATOSKY,JOHN J., MD  HISTORY OF PRESENT ILLNESS: Alison Burch is a 48 y.o. (DOB: 27-Jan-1966)  white  female whose primary care physician is SLATOSKY,JOHN J., MD and comes to me today for follow up of sleeve gastrecotmy.  Comes by herself.  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  She gets off weekends.  I talked to her about trying to get an hour of exercise a day - either with family helping out or hiring help.  She is on meats and limited vegetables and notices resistance to what she can eat. She has had some loose stools.  We talked about her gall stones.   History of weight loss (March 2015): Patient describes that the patient has been interested in weight loss surgery for some time. She went to an information session several years ago. She went a more recent information session presented by Dr. Andrey CampanileWilson. She is interested in a sleeve gastrectomy. She is not know anyone who has a sleeve gastrectomy. Her main weight loss has been to watch her calories. She has tried Atkins in the past. She had successful weight loss of about 46 pounds with Adipex and 2012 and but when she quit the Adipex, she regained her weight.  UGI - 08/01/2013 - Normal US abdomen - 08/01/2013 -  Gallstones (copy to patient) She saw Dr. Cyndia SkeetersLurey for psych - 08/26/2013 - he thought she was a good candidate   Past Medical History    Diagnosis Date  . Hyperlipidemia   . Hypertension   . Anxiety   . Depression   . Headache(784.0)     occasinal migraine     Past Surgical History  Procedure Laterality Date  . Knee reconstruction      knee cap  . Breath tek h pylori N/A 08/07/2013    Procedure: BREATH TEK H PYLORI;  Surgeon: Kandis Cockingavid H Chaylee Ehrsam, MD;  [facility];  Service: General;  Laterality: N/A;  . Laparoscopic gastric sleeve resection N/A 10/17/2013    Procedure: LAPAROSCOPIC GASTRIC SLEEVE RESECTION;  Surgeon: Kandis Cockingavid H Arden Tinoco, MD;  Location: [facility];  Service: General;  Laterality: N/A;  . Upper gi endoscopy  10/17/2013    Procedure: UPPER GI ENDOSCOPY;  Surgeon: Kandis Cockingavid H Obryan Radu, MD;  Location: [facility];  Service: General;;     Current Outpatient Prescriptions  Medication Sig Dispense Refill  . Acetaminophen-Aspirin Buffered (EXCEDRIN BACK & BODY PO) Take 1 tablet by mouth every 8 (eight) hours as needed (for pain).      Marland Kitchen. ALPRAZolam (XANAX) 0.25 MG tablet Take 0.25 mg by mouth at bedtime as needed for sleep.       . citalopram (CELEXA) 40 MG tablet Take 40 mg by mouth every morning.       . fenofibrate (TRICOR) 48 MG  tablet Take 48 mg by mouth daily.       Marland Kitchen lisinopril (PRINIVIL,ZESTRIL) 10 MG tablet Take 10 mg by mouth every morning.       . Melatonin 10 MG TABS Take 10 mg by mouth at bedtime as needed (for sleep).       . metoprolol succinate (TOPROL-XL) 100 MG 24 hr tablet Take 100 mg by mouth every morning.        No current facility-administered medications for this visit.     No Known Allergies  REVIEW OF SYSTEMS: Cardiac:  Hypertension x 1 year.  Gastrointestinal:  See HPI. No history of stomach disease.  No history of liver disease.  No history of gall bladder disease.  No history of pancreas disease.  No history of colon disease. Musculoskeletal:  She has had bilateral knee surgery for chronic dislocation.  She still has some trouble with dislocation. Psycho-social:  She saw Dr. Cyndia Skeeters.  History  of depression and anxiety.  SOCIAL and FAMILY HISTORY: Unmarried. No children. She takes care of her father who has Alzheimer's x 6 years.  She has no other job. She has limited support. She has 2 sisters and one brother.  They rotate taking care of their father on the weekends. Her sister, Rusty Aus, acts as her POA.  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  Sabrina's husband has had colon issues and Martie Lee has had to miss work to take care of him.    PHYSICAL EXAM: BP 124/84  Pulse 83  Temp(Src) 98 F (36.7 C)  Resp 18  Ht 5\' 6"  (1.676 m)  Wt 242 lb (109.77 kg)  BMI 39.08 kg/m2  LMP 08/18/2013  General: Obese WF who is alert and generally healthy appearing.  HEENT: Normal. Pupils equal. Lungs: Clear to auscultation and symmetric breath sounds. Heart:  RRR. No murmur or rub. Abdomen: Soft. No mass. No tenderness. No hernia. Normal bowel sounds.  Incisions look okay.  She points to the RUQ incision (the extraction site) as more painful.    DATA REVIEWED: Nothing new.  Ovidio Kin, MD,  [facility] [address]  [address]   [place], [place]    [address] Phone:  [phone] FAX:  [phone]

## 2013-11-21 NOTE — Telephone Encounter (Signed)
Attempted DROP discharge phone call per protocol, left message for patient, no call back recieved  Made discharge phone call to patient per DROP protocol. Asking the following questions.    1. Do you have someone to care for you now that you are home?   2. Are you having pain now that is not relieved by your pain medication?   3. Are you able to drink the recommended daily amount of fluids (48 ounces minimum/day) and protein (60-80 grams/day) as prescribed by the dietitian or nutritional counselor?   4. Are you taking the vitamins and minerals as prescribed?   5. Do you have the "on call" number to contact your surgeon if you have a problem or question?   6. Are your incisions free of redness, swelling or drainage? (If steri strips, address that these can fall off, shower as tolerated)  7. Have your bowels moved since your surgery?  If not, are you passing gas?   8. Are you up and walking 3-4 times per day?      1. Do you have an appointment made to see your surgeon in the next month?   2. Were you provided your discharge medications before your surgery or before you were discharged from the hospital and are you taking them without problem?   3. Were you provided phone numbers to the clinic/surgeon's office?   4. Did you watch the patient education video module in the (clinic, surgeon's office, etc.) before your surgery?  5. Do you have a discharge checklist that was provided to you in the hospital to reference with instructions on how to take care of yourself after surgery?   6. Did you see a dietitian or nutritional counselor while you were in the hospital?   7. Do you have an appointment to see a dietitian or nutritional counselor in the next month?

## 2013-12-12 ENCOUNTER — Encounter: Payer: BC Managed Care – PPO | Attending: Surgery | Admitting: Dietician

## 2013-12-12 DIAGNOSIS — Z713 Dietary counseling and surveillance: Secondary | ICD-10-CM | POA: Diagnosis present

## 2013-12-12 DIAGNOSIS — Z01818 Encounter for other preprocedural examination: Secondary | ICD-10-CM | POA: Insufficient documentation

## 2013-12-12 NOTE — Patient Instructions (Addendum)
-  Add protein snacks (try Laughing Cow cheese to spread on deli meat, string cheese, [place] bacon, eggs, add protein powder to yogurt, seafood, chili, [place] breast, P3) -Talk to doctor about meds -Stick to 15 grams of carbohydrates per meal  Goals:  Follow Phase 3B: High Protein + Non-Starchy Vegetables  Eat 3-6 [place] meals/snacks, every 3-5 hrs  Increase lean protein foods to meet 60g goal  Increase fluid intake to 64oz +  Avoid drinking 15 minutes before, during and 30 minutes after eating Aim for >30 min of physical activity daily   TANITA  BODY COMP RESULTS  10/02/2013 10/31/13 12/12/13   BMI (kg/m^2) 42.9 39.4 37.3   Fat Mass (lbs) 118.5 125.0 111.5   Fat Free Mass (lbs) 147 119.0 119.5   Total Body Water (lbs) 107.5 87 87.5

## 2013-12-12 NOTE — Progress Notes (Signed)
  Follow-up visit:  8 Weeks Post-Operative Gastric sleeve Surgery  Medical Nutrition Therapy:  Appt start time: 1030 end time:  1100.  Primary concerns today: Post-operative Bariatric Surgery Nutrition Management. Alison Burch returns today with a 13 pound weight loss. She reports tolerating most foods. However, fried food and [place] foods "don't agree with her."   Surgery date: 10/17/2013 Surgery type: Gastric sleeve [facility] weight at [facility]: 275 lbs on 08/21/2013 Weight today: 231 lbs Weight change: 13 lbs Total weight lost: 44 lbs  TANITA  BODY COMP RESULTS  10/02/2013 10/31/13 12/12/13   BMI (kg/m^2) 42.9 39.4 37.3   Fat Mass (lbs) 118.5 125.0 111.5   Fat Free Mass (lbs) 147 119.0 119.5   Total Body Water (lbs) 107.5 87 87.5    Preferred Learning Style:   No preference indicated   Learning Readiness:   Ready  24-hr recall: B (AM): Protein powder with skim milk (21 g) Snk (AM): none  L (PM): meat and cheese rollup (12g) Snk ( PM):   D (PM): chicken and green beans (12g) Snk ( PM):   Fluid intake: 64 oz Estimated total protein intake: ~45 grams  Medications: see list Supplementation: taking  Using straws: yes Drinking while eating: yes Hair loss: none Carbonated beverages: tried 1 N/V/D/C: diarrhea with lettuce Dumping syndrome: none  Recent physical activity:  "should be more" (  Progress Towards Goal(s):  In progress.  Handouts given during visit include:  Phase 3B lean protein + nonstarchy vegetables   Nutritional Diagnosis:  -3.3 Overweight/obesity related to past poor dietary habits and physical inactivity as evidenced by patient w/ recent gastric sleeve surgery following dietary guidelines for continued weight loss.     Intervention:  Nutrition counseling provided.  Teaching Method Utilized:  Visual Auditory  Barriers to learning/adherence to lifestyle change: family stress (caring for father with Alzheimer's disease)  Demonstrated degree of  understanding via:  Teach Back   Monitoring/Evaluation:  Dietary intake, exercise, and body weight. Follow up in 1 months for 3 month post-op visit.

## 2014-01-24 ENCOUNTER — Encounter: Payer: BC Managed Care – PPO | Attending: Surgery | Admitting: Dietician

## 2014-01-24 DIAGNOSIS — Z713 Dietary counseling and surveillance: Secondary | ICD-10-CM | POA: Diagnosis not present

## 2014-01-24 DIAGNOSIS — Z6834 Body mass index (BMI) 34.0-34.9, adult: Secondary | ICD-10-CM | POA: Insufficient documentation

## 2014-01-24 NOTE — Patient Instructions (Addendum)
-  Add protein snacks (try Laughing Cow cheese to spread on deli meat, string cheese, [place] bacon, eggs, add protein powder to yogurt, seafood, chili, [place] breast, P3) -Stick to 15 grams of carbohydrates per meal -Work on not drinking while eating -Avoid sweet tea -Continue increase physical activity  TANITA  BODY COMP RESULTS  10/02/2013 10/31/13 12/12/13 01/24/14   BMI (kg/m^2) 42.9 39.4 37.3 35.3   Fat Mass (lbs) 118.5 125.0 111.5 104.5   Fat Free Mass (lbs) 147 119.0 119.5 114.5   Total Body Water (lbs) 107.5 87 87.5 84

## 2014-01-24 NOTE — Progress Notes (Signed)
  Follow-up visit:  3 months Post-Operative Gastric sleeve Surgery  Medical Nutrition Therapy:  Appt start time: 800 end time:  815  Primary concerns today: Post-operative Bariatric Surgery Nutrition Management.   Alison Burch returns having lost 7 pounds of fat. She reports eating more protein and vegetables and is meeting her protein and fluid needs. She has been seeing Dr. Cyndia SkeetersLurey every 2 weeks to address her stress eating tendencies.   Surgery date: 10/17/2013 Surgery type: Gastric sleeve [facility] lbs on 08/21/2013 (288 lbs per patient) Weight today: 219 lbs Weight change: 12 lbs Total weight lost: 69 lbs Goal weight: 150 lbs  TANITA  BODY COMP RESULTS  10/02/2013 10/31/13 12/12/13 01/24/14   BMI (kg/m^2) 42.9 39.4 37.3 35.3   Fat Mass (lbs) 118.5 125.0 111.5 104.5   Fat Free Mass (lbs) 147 119.0 119.5 114.5   Total Body Water (lbs) 107.5 87 87.5 84    Preferred Learning Style:   No preference indicated   Learning Readiness:   Ready  24-hr recall: B (AM): Egg and piece of bacon (12g) Snk (AM): none  L (PM): [place] or ham and cheese rollup with vegetables(12g) Snk ( PM):  Protein shake (22g) D (PM): chicken and green beans (12g) Snk ( PM):   Fluid intake: water and 1/2 sweet/unsweet tea (64 oz total) Estimated total protein intake: ~60 grams per day  Medications: see list; Celexa and Wellbutrin only Supplementation: taking  Using straws: yes Drinking while eating: yes, sips (trying not to) Hair loss: none Carbonated beverages: none N/V/D/C: none Dumping syndrome: none  Recent physical activity:  Trying to walk more; pilates machine  Progress Towards Goal(s):  In progress.   Nutritional Diagnosis:  [name]-3.3 Overweight/obesity related to past poor dietary habits and physical inactivity as evidenced by patient w/ recent gastric sleeve surgery following dietary guidelines for continued weight loss.     Intervention:  Nutrition counseling  provided.  Teaching Method Utilized:  Visual Auditory  Barriers to learning/adherence to lifestyle change: family stress (caring for father with Alzheimer's disease)  Demonstrated degree of understanding via:  Teach Back   Monitoring/Evaluation:  Dietary intake, exercise, and body weight. Follow up in 2 months for 5 month post-op visit.

## 2014-02-15 ENCOUNTER — Other Ambulatory Visit (INDEPENDENT_AMBULATORY_CARE_PROVIDER_SITE_OTHER): Payer: Self-pay | Admitting: Surgery

## 2014-02-15 ENCOUNTER — Ambulatory Visit (INDEPENDENT_AMBULATORY_CARE_PROVIDER_SITE_OTHER): Payer: BC Managed Care – PPO | Admitting: Surgery

## 2014-02-15 DIAGNOSIS — Z903 Acquired absence of stomach [part of]: Secondary | ICD-10-CM

## 2014-03-27 ENCOUNTER — Encounter: Payer: BC Managed Care – PPO | Attending: Surgery | Admitting: Dietician

## 2014-03-27 DIAGNOSIS — Z6834 Body mass index (BMI) 34.0-34.9, adult: Secondary | ICD-10-CM | POA: Insufficient documentation

## 2014-03-27 DIAGNOSIS — Z713 Dietary counseling and surveillance: Secondary | ICD-10-CM | POA: Diagnosis not present

## 2014-03-27 NOTE — Progress Notes (Signed)
  Follow-up visit:  6 months Post-Operative Gastric sleeve Surgery  Medical Nutrition Therapy:  Appt start time: 805 end time:  825  Primary concerns today: Post-operative Bariatric Surgery Nutrition Management.   Adilee returns having lost another 5 lbs. She recently had to place her father in a nursing home and this has been stressful for her. However, she is able to get outside more but still needs to exercise. Realizes she needs to "stay focused" as she can eat more volume. Still seeing Dr. Cyndia SkeetersLurey regularly.   Surgery date: 10/17/2013 Surgery type: Gastric sleeve [facility] lbs on 08/21/2013 (288 lbs per patient) Weight today: 214 lbs Weight change: 5 lbs Total weight lost: 74 lbs Goal weight: 150 lbs  TANITA  BODY COMP RESULTS  10/02/2013 10/31/13 12/12/13 01/24/14 03/27/14   BMI (kg/m^2) 42.9 39.4 37.3 35.3 34.5   Fat Mass (lbs) 118.5 125.0 111.5 104.5 101   Fat Free Mass (lbs) 147 119.0 119.5 114.5 113   Total Body Water (lbs) 107.5 87 87.5 84 82.5    Preferred Learning Style:   No preference indicated   Learning Readiness:   Ready  24-hr recall: B (AM): ham OR Egg and piece of bacon (12g) Snk (AM): none  L (PM): [place] or ham and cheese rollup with vegetables (12g) Snk ( PM): sometimes Protein shake (22g) D (PM): chicken and green beans, sometimes a flour tortilla (12g) Snk ( PM):   Fluid intake: water and 1/2 sweet/unsweet tea (64 oz total) (working on cutting sugar out of sweet tea) Estimated total protein intake: ~60 grams per day  Medications: see list; Celexa and Wellbutrin only Supplementation: taking  Using straws: yes Drinking while eating: yes, sips (trying not to) Hair loss: thinning some, taking Biotin Carbonated beverages: none N/V/D/C: n/v 1x when she drank too fast Dumping syndrome: none  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   Nutritional Diagnosis:  [place]-3.3  Overweight/obesity related to past poor dietary habits and physical inactivity as evidenced by patient w/ recent gastric sleeve surgery following dietary guidelines for continued weight loss.     Intervention:  Nutrition counseling provided.  Teaching Method Utilized:  Visual Auditory  Barriers to learning/adherence to lifestyle change: family stress  Demonstrated degree of understanding via:  Teach Back   Monitoring/Evaluation:  Dietary intake, exercise, and body weight. Follow up in 2 months for 8 month post-op visit.

## 2014-03-27 NOTE — Patient Instructions (Addendum)
-  Work on developing a routine that includes exercise  [address] -Cut back on sugar in sweet tea  TANITA  BODY COMP RESULTS  10/02/2013 10/31/13 12/12/13 01/24/14 03/27/14   BMI (kg/m^2) 42.9 39.4 37.3 35.3 34.5   Fat Mass (lbs) 118.5 125.0 111.5 104.5 101   Fat Free Mass (lbs) 147 119.0 119.5 114.5 113   Total Body Water (lbs) 107.5 87 87.5 84 82.5

## 2014-05-28 ENCOUNTER — Ambulatory Visit: Payer: BC Managed Care – PPO | Admitting: Dietician

## 2014-06-12 ENCOUNTER — Other Ambulatory Visit (INDEPENDENT_AMBULATORY_CARE_PROVIDER_SITE_OTHER): Payer: Self-pay

## 2015-05-26 IMAGING — RF DG UGI W/ KUB
14 series · 14 of 14 positions shown · non-contrast
Comparison: None.

CLINICAL DATA: Pre bariatric screening

EXAM:
UPPER GI SERIES WITH KUB
TECHNIQUE: After obtaining a scout radiograph a routine upper GI series was
performed using thin and high density barium.
FLUOROSCOPY TIME:  1 min 6 seconds

[Series 1: run · 1 of 1 slices shown (1 of 13)]
[im 1/1]
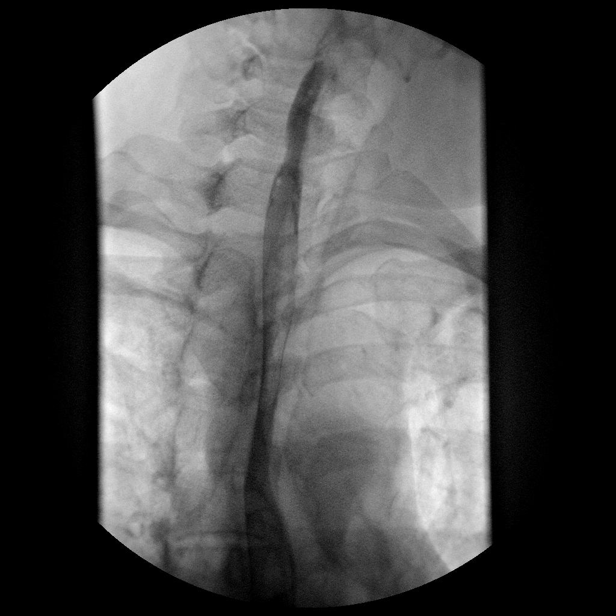

[Series 2: run · 1 of 1 slices shown (2 of 13)]
[im 1/1]
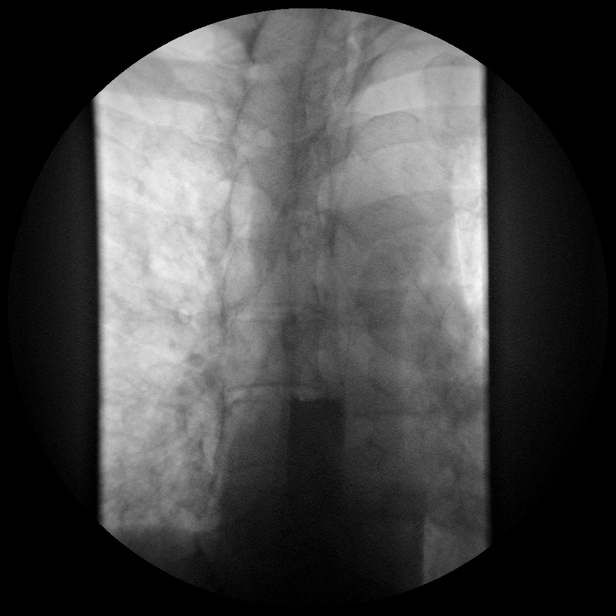

[Series 3: run · 1 of 1 slices shown (3 of 13)]
[im 1/1]
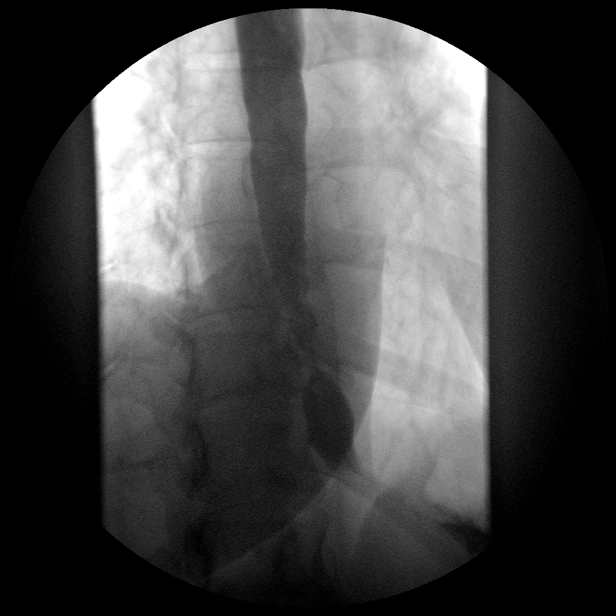

[Series 4: run · 1 of 1 slices shown (4 of 13)]
[im 1/1]
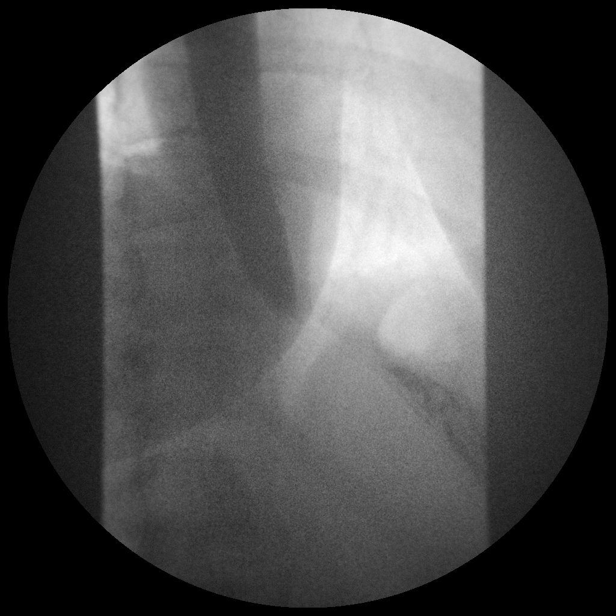

[Series 5: run · 1 of 1 slices shown (5 of 13)]
[im 1/1]
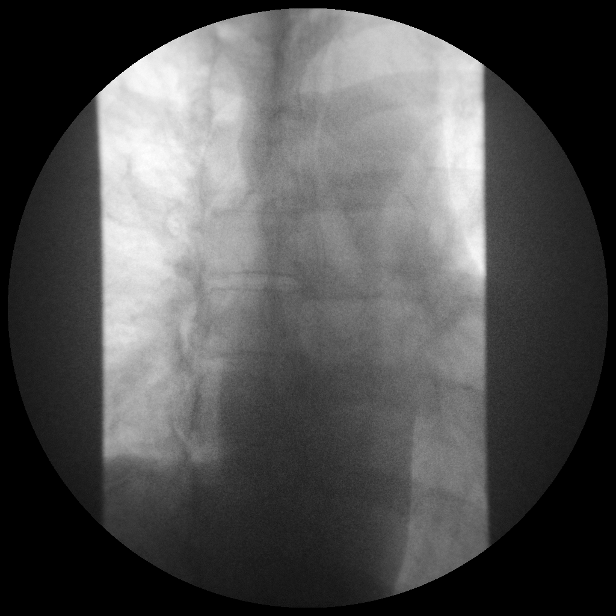

[Series 6: run · 1 of 1 slices shown (6 of 13)]
[im 1/1]
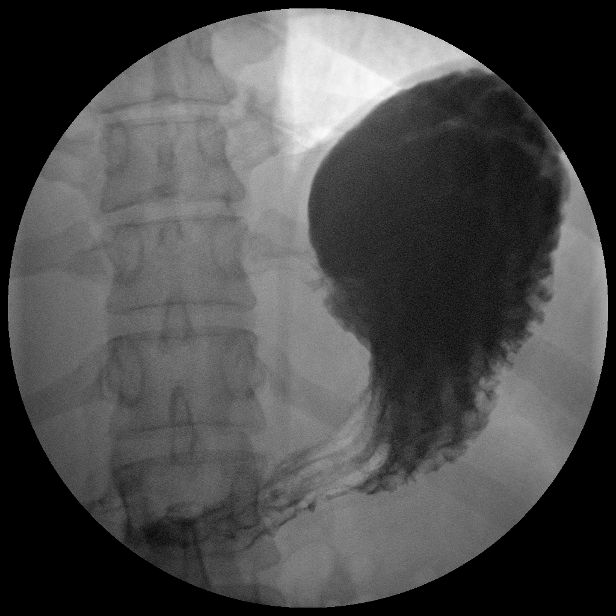

[Series 7: run · 1 of 1 slices shown (7 of 13)]
[im 1/1]
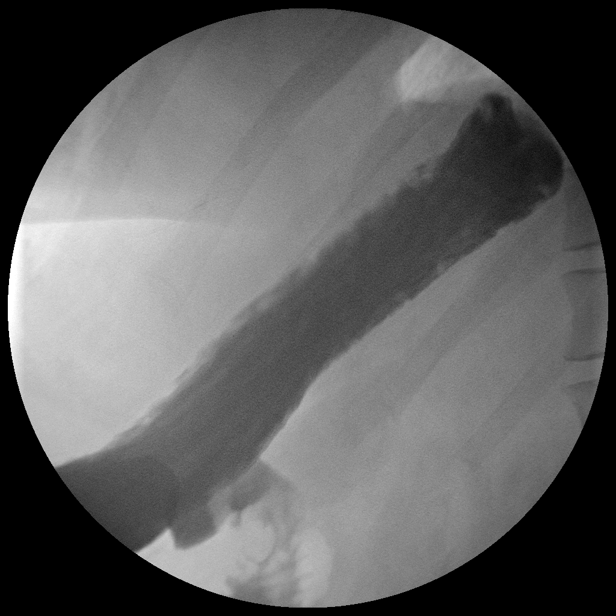

[Series 8: run · 1 of 1 slices shown (8 of 13)]
[im 1/1]
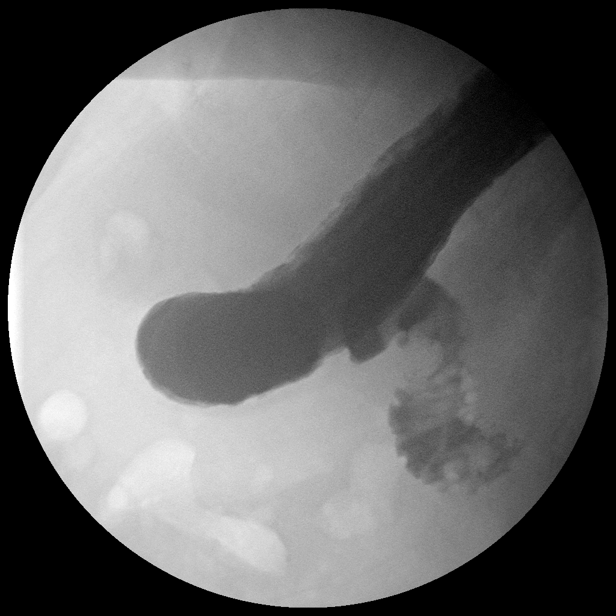

[Series 9: run · 1 of 1 slices shown (9 of 13)]
[im 1/1]
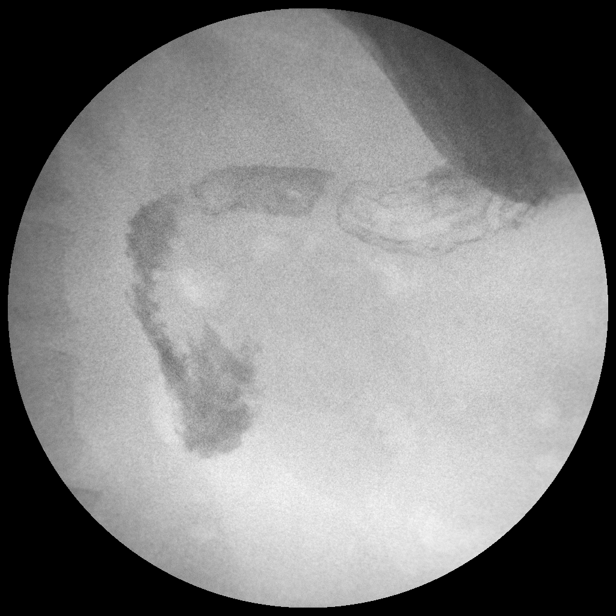

[Series 10: run · 1 of 1 slices shown (10 of 13)]
[im 1/1]
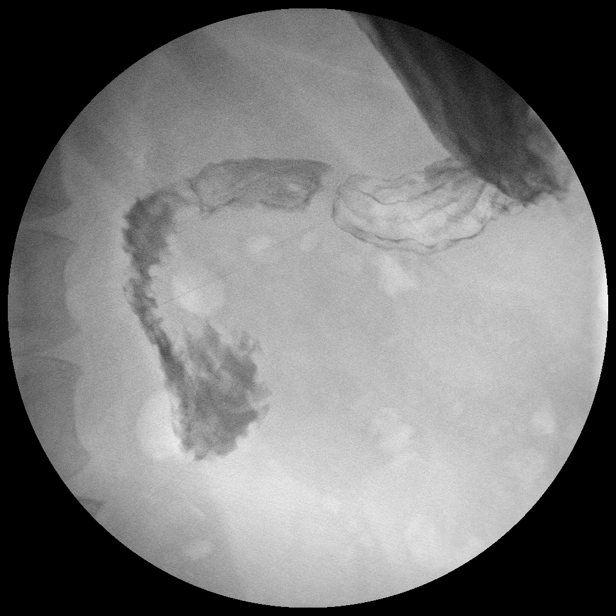

[Series 11: run · 1 of 1 slices shown (11 of 13)]
[im 1/1]
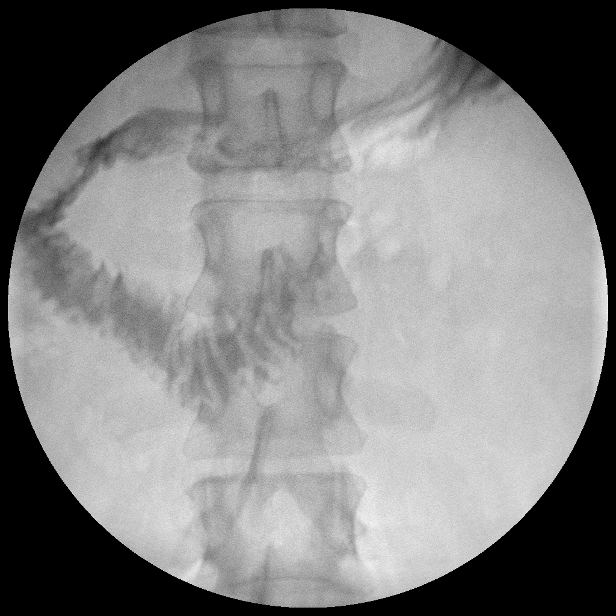

[Series 12: run · 1 of 1 slices shown (12 of 13)]
[im 1/1]
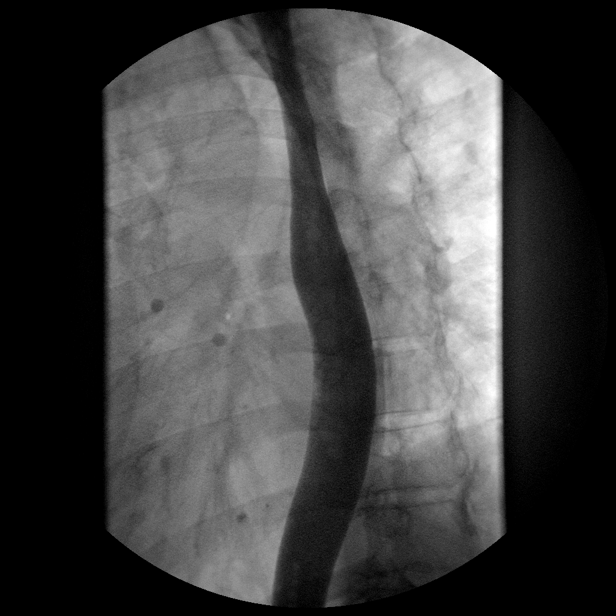

[Series 13: run · 1 of 1 slices shown (13 of 13)]
[im 1/1]
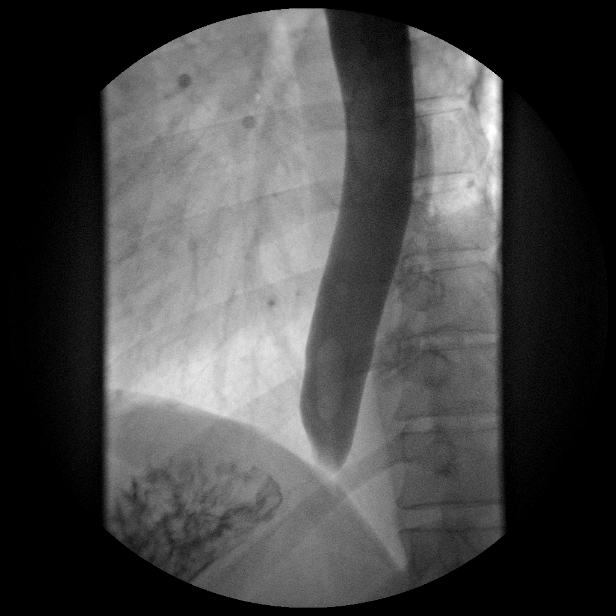

[Series 1001: view not recorded · 0.20mm/px · 1 of 1 slices shown]
[im 1/1]
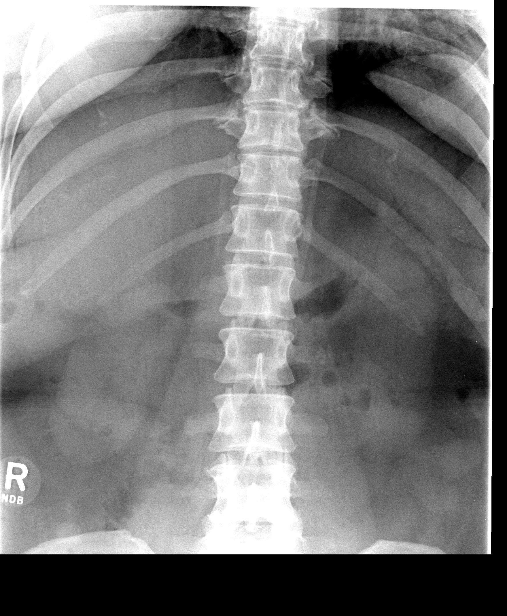

[14 of 14 positions shown; findings below may reference images not displayed]

FINDINGS: The esophagus is patent and has a normal course and caliber. No
stricture or mass identified. The stomach appears normal. The
duodenal bulb and sweep are unremarkable. Normal motility of the
esophagus. No reflux identified.
IMPRESSION: 1. Normal upper GI exam.

## 2016-05-18 ENCOUNTER — Encounter (HOSPITAL_COMMUNITY): Payer: Self-pay

## 2017-05-17 ENCOUNTER — Encounter (HOSPITAL_COMMUNITY): Payer: Self-pay

## 2017-11-27 ENCOUNTER — Encounter (HOSPITAL_COMMUNITY): Payer: Self-pay | Admitting: Emergency Medicine

## 2017-11-27 ENCOUNTER — Other Ambulatory Visit: Payer: Self-pay

## 2017-11-27 ENCOUNTER — Emergency Department (HOSPITAL_COMMUNITY)
Admission: EM | Admit: 2017-11-27 | Discharge: 2017-11-27 | Disposition: A | Discharge status: Home / Self Care | Payer: BLUE CROSS/BLUE SHIELD | Attending: Emergency Medicine | Admitting: Emergency Medicine

## 2017-11-27 DIAGNOSIS — M25522 Pain in left elbow: Secondary | ICD-10-CM | POA: Diagnosis present

## 2017-11-27 DIAGNOSIS — I10 Essential (primary) hypertension: Secondary | ICD-10-CM | POA: Diagnosis not present

## 2017-11-27 DIAGNOSIS — Z79899 Other long term (current) drug therapy: Secondary | ICD-10-CM | POA: Diagnosis not present

## 2017-11-27 MED ORDER — PREDNISONE 50 MG PO TABS: ORAL_TABLET | ORAL | 0 refills | Status: DC

## 2017-11-27 MED ORDER — METHOCARBAMOL 500 MG PO TABS: 500.0000 mg | ORAL_TABLET | Freq: Two times a day (BID) | ORAL | 0 refills | Status: DC

## 2017-11-27 MED ORDER — HYDROCODONE-ACETAMINOPHEN 5-325 MG PO TABS: 2.0000 | ORAL_TABLET | ORAL | 0 refills | Status: DC | PRN

## 2017-11-27 NOTE — ED Provider Notes (Signed)
[facility]-EMERGENCY DEPT Provider Note   CSN: [id number] Arrival date & time: 11/27/17  0755     History   Chief Complaint Chief Complaint  Patient presents with  . elbow pain    HPI Alison Burch is a 52 y.o. female.  52 year old female complains of atraumatic left elbow pain x24 hours.  States that she has a history of lipomas and has had similar symptoms in the past.  Pain is characterizes dull and worse with any movement.  Denies any elbow joint pain itself.  Pain is localized to the medial aspect of her left elbow.  No fever or chills.  No distal numbness or tingling.  Symptoms better with rest and not relieved with over-the-counter medication.     Past Medical History:  Diagnosis Date  . Anxiety   . Depression   . Headache(784.0)    occasinal migraine  . Hyperlipidemia   . Hypertension     Patient Active Problem List   Diagnosis Date Noted  . 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 Morbid obesity with body mass index of 40.0-44.9 in adult (HCC) 10/17/2013  . Morbid obesity (HCC) 07/12/2013    Past Surgical History:  Procedure Laterality Date  . BREATH TEK H PYLORI N/A 08/07/2013   Procedure: BREATH TEK H PYLORI;  Surgeon: Kandis Cocking, MD;  Location: [facility];  Service: General;  Laterality: N/A;  . KNEE RECONSTRUCTION     knee cap  . LAPAROSCOPIC GASTRIC SLEEVE RESECTION N/A 10/17/2013   Procedure: LAPAROSCOPIC GASTRIC SLEEVE RESECTION;  Surgeon: Kandis Cocking, MD;  Location: [facility];  Service: General;  Laterality: N/A;  . UPPER GI ENDOSCOPY  10/17/2013   Procedure: UPPER GI ENDOSCOPY;  Surgeon: Kandis Cocking, MD;  Location: [facility];  Service: General;;     OB History   None      Home Medications    Prior to Admission medications   Medication Sig Start Date End Date Taking? Authorizing Provider  Acetaminophen-Aspirin Buffered (EXCEDRIN BACK & BODY PO) Take 1 tablet by mouth every 8 (eight) hours as needed  (for pain).    [provider]  ALPRAZolam Prudy Feeler) 0.25 MG tablet Take 0.25 mg by mouth at bedtime as needed for sleep.  07/07/13   [provider]  citalopram (CELEXA) 40 MG tablet Take 40 mg by mouth every morning.  07/03/13   [provider]  fenofibrate (TRICOR) 48 MG tablet Take 48 mg by mouth daily.  07/03/13   [provider]  lisinopril (PRINIVIL,ZESTRIL) 10 MG tablet Take 10 mg by mouth every morning.  07/03/13   [provider]  Melatonin 10 MG TABS Take 10 mg by mouth at bedtime as needed (for sleep).     [provider]  metoprolol succinate (TOPROL-XL) 100 MG 24 hr tablet Take 100 mg by mouth every morning.  07/03/13   [provider]    Family History Family History  Problem Relation Age of Onset  . Hyperlipidemia Father   . Hypertension Father   . Heart disease Father   . Cancer Father        colon  . Cancer Paternal Aunt        breast    Social History Social History   Tobacco Use  . Smoking status: Never Smoker  . Smokeless tobacco: Never Used  Substance Use Topics  . Alcohol use: Yes    Comment: occAsional   . Drug use: No  Allergies   Patient has no known allergies.   Review of Systems Review of Systems  All other systems reviewed and are negative.    Physical Exam Updated Vital Signs BP (!) 155/84 (BP Location: Right Arm)   Pulse 97   Temp 98.2 F (36.8 C) (Oral)   Resp 16   LMP 11/13/2017 (Exact Date)   SpO2 100%   Physical Exam  Constitutional: She is oriented to person, place, and time. She appears well-developed and well-nourished.  Non-toxic appearance. No distress.  HENT:  Head: Normocephalic and atraumatic.  Eyes: Pupils are equal, round, and reactive to light. Conjunctivae, EOM and lids are normal.  Neck: Normal range of motion. Neck supple. No tracheal deviation present. No thyroid mass present.  Cardiovascular: Normal rate, regular rhythm and normal heart sounds. Exam  reveals no gallop.  No murmur heard. Pulmonary/Chest: Effort normal and breath sounds normal. No stridor. No respiratory distress. She has no decreased breath sounds. She has no wheezes. She has no rhonchi. She has no rales.  Abdominal: Soft. Normal appearance and bowel sounds are normal. She exhibits no distension. There is no tenderness. There is no rebound and no CVA tenderness.  Musculoskeletal: Normal range of motion. She exhibits no edema or tenderness.       Arms: Neurological: She is alert and oriented to person, place, and time. She has normal strength. No cranial nerve deficit or sensory deficit. GCS eye subscore is 4. GCS verbal subscore is 5. GCS motor subscore is 6.  Skin: Skin is warm and dry. No abrasion and no rash noted.  Psychiatric: She has a normal mood and affect. Her speech is normal and behavior is normal.  Nursing note and vitals reviewed.    ED Treatments / Results  Labs (all labs ordered are listed, but only abnormal results are displayed) Labs Reviewed - No data to display  EKG None  Radiology No results found.  Procedures Procedures (including critical care time)  Medications Ordered in ED Medications - No data to display   Initial Impression / Assessment and Plan / ED Course  I have reviewed the triage vital signs and the nursing notes.  Pertinent labs & imaging results that were available during my care of the patient were reviewed by me and considered in my medical decision making (see chart for details).     Patient does not appear to have any elbow joint pathology at this time.  No suspicions for septic joint.  Likely from her prior lipoma without evidence of infection.  Will treat symptomatically at this time  Final Clinical Impressions(s) / ED Diagnoses   Final diagnoses:  None    ED Discharge Orders    None       Lorre NickAllen, [place]ia Caslin, MD 11/27/17 [phone]

## 2017-11-27 NOTE — ED Triage Notes (Signed)
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 Pt states she has L elbow pain since yesterday, pt took advil yesterday. Pain radiates to hand and pt reports numbness at times in L hand.

## 2019-07-13 ENCOUNTER — Encounter: Payer: Self-pay | Admitting: Physical Medicine and Rehabilitation

## 2019-08-03 ENCOUNTER — Encounter
Payer: BC Managed Care – PPO | Attending: Physical Medicine and Rehabilitation | Admitting: Physical Medicine and Rehabilitation

## 2019-08-03 ENCOUNTER — Encounter: Payer: Self-pay | Admitting: Physical Medicine and Rehabilitation

## 2019-08-03 ENCOUNTER — Other Ambulatory Visit: Payer: Self-pay

## 2019-08-03 VITALS — BP 144/84 | HR 82 | Temp 97.3°F | Ht 66.0 in | Wt 219.6 lb

## 2019-08-03 DIAGNOSIS — Z79891 Long term (current) use of opiate analgesic: Secondary | ICD-10-CM | POA: Insufficient documentation

## 2019-08-03 DIAGNOSIS — Z5181 Encounter for therapeutic drug level monitoring: Secondary | ICD-10-CM | POA: Diagnosis present

## 2019-08-03 DIAGNOSIS — G894 Chronic pain syndrome: Secondary | ICD-10-CM | POA: Diagnosis not present

## 2019-08-03 DIAGNOSIS — M5417 Radiculopathy, lumbosacral region: Secondary | ICD-10-CM | POA: Diagnosis present

## 2019-08-03 MED ORDER — METHOCARBAMOL 500 MG PO TABS: 500.0000 mg | ORAL_TABLET | Freq: Two times a day (BID) | ORAL | 1 refills | Status: DC

## 2019-08-03 NOTE — Progress Notes (Signed)
Subjective:    Patient ID: Alison Burch, female    DOB: 06/23/1965, 54 y.o.   MRN: [id number]  HPI  [name] is a 54 year old woman who presents for lower back pain that radiates into the bilateral lower extremities for >10 years. The pain that radiates into her legs just started three months ago after she has had a fall.   She has had epidural steroid injections by Dr. Ethelene Hal that provided several years of relief. Her last injection was many years ago.  More recently she had an injection in her buttocks that did not provide much relief.   She does note fecal incontinence about once per week that is new since her fall three months ago. Denies weakness in her lower extremities, saddle anesthesia, or urinary incontinence.   She is interested in getting another epidural steroid injection since this helped so much before. 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  She has been using heating pads and warm showers which help a lot with the pain. She has tried Tylenol and NSAIDs. The pain located in her back is worse than the pain radiating into her legs.   Pain is worst with flexion and twisting.    Pain Inventory Average Pain 8 Pain Right Now 7 My pain is constant, sharp and aching  In the last 24 hours, has pain interfered with the following? General activity 6 Relation with others 4 Enjoyment of life 4 What TIME of day is your pain at its worst? night Sleep (in general) Poor  Pain is worse with: sitting and some activites Pain improves with: rest and medication Relief from Meds: 2  Mobility walk without assistance how many minutes can you walk? on feet about 8-9 hours with work ability to climb steps?  yes do you drive?  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  no  Has previous x ray/ scan with her. Has had back injections previously with Ramos and another MD  Physicians involved in your care Primary care [facility]   Family History  Problem Relation Age of Onset  . Hyperlipidemia Father   . Hypertension Father   . Heart disease Father   . Cancer Father        colon  . Cancer Paternal Aunt        breast   Social History   Socioeconomic History  . Marital status: Divorced    Spouse name: Not on file  . Number of children: Not on file  . Years of education: Not on file  . Highest education level: Not on file  Occupational History  . Not on file  Tobacco Use  . Smoking status: Never Smoker  . Smokeless tobacco: Never Used  Substance and Sexual Activity  . Alcohol use: Yes    Comment: occAsional   . Drug use: No  . Sexual activity: Not on file  Other Topics Concern  . Not on file  Social History Narrative  . Not on file   Social Determinants of Health   Financial Resource Strain:   . Difficulty of Paying Living Expenses:   Food Insecurity:   . Worried About Programme researcher, broadcasting/film/video  in the Last Year:   . Ran Out of Food in the Last Year:   Transportation Needs:   . Freight forwarder (Medical):   Marland Kitchen Lack of Transportation (Non-Medical):   Physical Activity:   . Days of Exercise per Week:   . Minutes of Exercise per Session:   Stress:   . Feeling of Stress :   Social Connections:   . Frequency of Communication with Friends and Family:   . Frequency of Social Gatherings with Friends and Family:   . Attends Religious Services:   . Active Member of Clubs or Organizations:   . Attends Banker Meetings:   Marland Kitchen Marital Status:    Past Surgical History:  Procedure Laterality Date  . BREATH TEK H PYLORI N/A 08/07/2013   Procedure: BREATH TEK H PYLORI;  Surgeon: Kandis Cocking, MD;  Location: [facility];  Service: General;  Laterality: N/A;  . KNEE RECONSTRUCTION     knee cap  . LAPAROSCOPIC GASTRIC SLEEVE RESECTION N/A 10/17/2013   Procedure: LAPAROSCOPIC GASTRIC SLEEVE RESECTION;  Surgeon: Kandis Cocking, MD;  Location: [facility];  Service: General;  Laterality: N/A;  . UPPER GI ENDOSCOPY  10/17/2013   Procedure: UPPER GI ENDOSCOPY;  Surgeon: Kandis Cocking, MD;  Location: [facility];  Service: General;;   Past Medical History:  Diagnosis Date  . Anxiety   . Depression   . Headache(784.0)    occasinal migraine  . Hyperlipidemia   . Hypertension    BP (!) 144/84   Pulse 82   Temp (!) 97.3 F (36.3 C)   Ht 5\' 6"  (1.676 m)   Wt 219 lb 9.6 oz (99.6 kg)   SpO2 98%   BMI 35.44 kg/m   Opioid Risk Score:   Fall Risk Score:  `1  Depression screen PHQ 2/9  Depression screen PHQ 2/9 08/03/2019  Decreased Interest 1  Down, Depressed, Hopeless 1  PHQ - 2 Score 2  Altered sleeping 3  Tired, decreased energy 2  Change in appetite 1  Feeling bad or failure about yourself  0  Trouble concentrating 0  Moving slowly or fidgety/restless 0  Suicidal thoughts 0  PHQ-9 Score 8  Difficult doing work/chores Somewhat difficult   Review of Systems  Constitutional: Negative.   HENT: Negative.   Eyes: Negative.   Respiratory: Negative.   Cardiovascular: Negative.   Gastrointestinal:       Bowel control  Endocrine: Negative.   Genitourinary:       Bladder control  Musculoskeletal: Positive for gait problem.       Spasms  Skin: Negative.   Allergic/Immunologic: Negative.   Neurological: Positive for weakness.  Hematological: Negative.   Psychiatric/Behavioral: Positive for dysphoric mood. The patient is nervous/anxious.   All other systems reviewed and are negative.      Objective:   Physical Exam Gen: no distress, normal appearing HEENT: oral mucosa pink and moist, NCAT Cardio: Reg rate Chest: normal effort, normal rate of breathing Abd: soft, non-distended Ext: no  edema Skin: intact Neuro: AOx3.  Musculoskeletal: 5/5 strength throughout Sensation is intact throughout. Has full and painfless flexion and extension of lumbar spine. No tenderness to palpation over spine.  Negative slump test bilaterally.  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  -Reviewed most recent XR which shows L5-S1  disc degeneration. -Given concerning new fecal incontinence and prior benefit from spinal injections, will order MRI to assess for spinal cord compression (no neurological signs on exam) and to guide potential spinal injection.  -RTC in 2 weeks to discuss results of MRI and to discuss plan based on results. -In interim for pain control, I have prescribed Robaxin to help with muscle spasm. Advised that this is less sedating than other muscle spasm medications. -Advised to minimize bending and twisting motions.  -Advised regarding worrisome signs she should look out for.

## 2019-08-15 ENCOUNTER — Other Ambulatory Visit: Payer: Self-pay

## 2019-08-15 ENCOUNTER — Ambulatory Visit (HOSPITAL_COMMUNITY)
Admission: RE | Admit: 2019-08-15 | Discharge: 2019-08-15 | Disposition: A | Discharge status: Home / Self Care | Payer: BC Managed Care – PPO | Source: Ambulatory Visit | Attending: Physical Medicine and Rehabilitation | Admitting: Physical Medicine and Rehabilitation

## 2019-08-15 DIAGNOSIS — M5417 Radiculopathy, lumbosacral region: Secondary | ICD-10-CM | POA: Diagnosis present

## 2019-08-30 ENCOUNTER — Encounter
Payer: BC Managed Care – PPO | Attending: Physical Medicine and Rehabilitation | Admitting: Physical Medicine and Rehabilitation

## 2019-08-30 ENCOUNTER — Encounter: Payer: Self-pay | Admitting: Physical Medicine and Rehabilitation

## 2019-08-30 ENCOUNTER — Other Ambulatory Visit: Payer: Self-pay

## 2019-08-30 VITALS — BP 120/77 | HR 88 | Temp 97.7°F | Ht 66.0 in | Wt 221.0 lb

## 2019-08-30 DIAGNOSIS — M5417 Radiculopathy, lumbosacral region: Secondary | ICD-10-CM | POA: Insufficient documentation

## 2019-08-30 DIAGNOSIS — Z5181 Encounter for therapeutic drug level monitoring: Secondary | ICD-10-CM | POA: Diagnosis present

## 2019-08-30 DIAGNOSIS — M48062 Spinal stenosis, lumbar region with neurogenic claudication: Secondary | ICD-10-CM | POA: Diagnosis not present

## 2019-08-30 DIAGNOSIS — M47816 Spondylosis without myelopathy or radiculopathy, lumbar region: Secondary | ICD-10-CM

## 2019-08-30 DIAGNOSIS — Z79891 Long term (current) use of opiate analgesic: Secondary | ICD-10-CM | POA: Insufficient documentation

## 2019-08-30 DIAGNOSIS — G894 Chronic pain syndrome: Secondary | ICD-10-CM | POA: Diagnosis present

## 2019-08-30 DIAGNOSIS — Z6841 Body Mass Index (BMI) 40.0 and over, adult: Secondary | ICD-10-CM | POA: Diagnosis not present

## 2019-08-30 MED ORDER — TIZANIDINE HCL 4 MG PO TABS: 4.0000 mg | ORAL_TABLET | Freq: Four times a day (QID) | ORAL | 0 refills | Status: DC | PRN

## 2019-08-30 NOTE — Progress Notes (Signed)
Subjective:    Patient ID: Norm Parcel, female    DOB: Dec 03, 1965, 54 y.o.   MRN: [id number]  HPI Mrs. Pang Robers is a 54 year old woman who presents for lower back pain that radiates into the bilateral lower extremities for >10 years. The pain that radiates into her legs just started three months ago after she has had a fall.    She has had spinal steroid injections by Dr. Nelva Bush that provided several years of relief. Her last injection was many years ago. She is not sure what type of injections these were.    More recently she had an injection in her buttocks (>6 months ago) that did not provide much relief.    She does note fecal incontinence about once per week that is new since her fall three months ago. Denies weakness in her lower extremities, saddle anesthesia, or urinary incontinence.    She is interested in getting another steroid injection since this helped so much before.    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   She has been using heating pads and warm showers which help a lot with the pain. She has tried Tylenol and NSAIDs. The pain located in her back is worse than the pain radiating into her legs.    Pain is worst with flexion and twisting. It is worst at night. She got relief from Robaxin initially but it has since worn off.  Pain Inventory Average Pain 7 Pain Right Now 9 My pain is constant, sharp and aching  In the last 24 hours, has pain interfered with the following? General activity 5 Relation with others 3 Enjoyment of life 3 What TIME of day is your pain at its worst? evening, night  Sleep (in general) Poor  Pain is worse with: sitting and some activites Pain improves with: rest, heat/ice and medication Relief from Meds: 2  Mobility walk without assistance ability to climb steps?  yes do you drive?  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  no  Physicians involved in your care Any changes since last visit?  no   Family History  Problem Relation Age of Onset  . Hyperlipidemia Father   . Hypertension Father   . Heart disease Father   . Cancer Father        colon  . Cancer Paternal Aunt        breast   Social History   Socioeconomic History  . Marital status: Divorced    Spouse name: Not on file  . Number of children: Not on file  . Years of education: Not on file  . Highest education level: Not on file  Occupational History  . Not on file  Tobacco Use  . Smoking status: Never Smoker  . Smokeless tobacco: Never Used  Substance and Sexual Activity  . Alcohol use: Yes    Comment: occAsional   . Drug use: No  . Sexual activity: Not on file  Other Topics Concern  . Not on file  Social History Narrative  . Not on file   Social Determinants of Health   Financial Resource Strain:   . Difficulty of Paying Living Expenses:   Food Insecurity:   . Worried About Charity fundraiser in the Last Year:   . Justice in the Last  Year:   Transportation Needs:   . Freight forwarder (Medical):   Marland Kitchen Lack of Transportation (Non-Medical):   Physical Activity:   . Days of Exercise per Week:   . Minutes of Exercise per Session:   Stress:   . Feeling of Stress :   Social Connections:   . Frequency of Communication with Friends and Family:   . Frequency of Social Gatherings with Friends and Family:   . Attends Religious Services:   . Active Member of Clubs or Organizations:   . Attends Banker Meetings:   Marland Kitchen Marital Status:    Past Surgical History:  Procedure Laterality Date  . BREATH TEK H PYLORI N/A 08/07/2013   Procedure: BREATH TEK H PYLORI;  Surgeon: Kandis Cocking, MD;  Location: [facility];  Service: General;  Laterality: N/A;  . KNEE RECONSTRUCTION     knee cap  . LAPAROSCOPIC GASTRIC SLEEVE  RESECTION N/A 10/17/2013   Procedure: LAPAROSCOPIC GASTRIC SLEEVE RESECTION;  Surgeon: Kandis Cocking, MD;  Location: [facility];  Service: General;  Laterality: N/A;  . UPPER GI ENDOSCOPY  10/17/2013   Procedure: UPPER GI ENDOSCOPY;  Surgeon: Kandis Cocking, MD;  Location: [facility];  Service: General;;   Past Medical History:  Diagnosis Date  . Anxiety   . Depression   . Headache(784.0)    occasinal migraine  . Hyperlipidemia   . Hypertension    BP 120/77   Pulse 88   Temp 97.7 F (36.5 C)   Ht 5\' 6"  (1.676 m)   Wt 221 lb (100.2 kg)   SpO2 98%   BMI 35.67 kg/m   Opioid Risk Score:   Fall Risk Score:  `1  Depression screen PHQ 2/9  Depression screen PHQ 2/9 08/03/2019  Decreased Interest 1  Down, Depressed, Hopeless 1  PHQ - 2 Score 2  Altered sleeping 3  Tired, decreased energy 2  Change in appetite 1  Feeling bad or failure about yourself  0  Trouble concentrating 0  Moving slowly or fidgety/restless 0  Suicidal thoughts 0  PHQ-9 Score 8  Difficult doing work/chores Somewhat difficult    Review of Systems  Constitutional: Negative.   HENT: Negative.   Eyes: Negative.   Respiratory: Negative.   Cardiovascular: Negative.   Gastrointestinal: Positive for diarrhea.  Endocrine: Negative.   Genitourinary: Negative.   Musculoskeletal: Positive for arthralgias and back pain.  Skin: Negative.   Allergic/Immunologic: Negative.   Neurological: Negative.   Hematological: Negative.   Psychiatric/Behavioral: Positive for dysphoric mood.  All other systems reviewed and are negative.      Objective:   Physical Exam Gen: no distress, normal appearing HEENT: oral mucosa pink and moist, NCAT Cardio: Reg rate Chest: normal effort, normal rate of breathing Abd: soft, non-distended Ext: no edema Skin: intact Neuro: AOx3.  Musculoskeletal: 5/5 strength throughout Sensation is intact throughout. Has full and painfless flexion and extension of lumbar spine. No tenderness  to palpation over spine.  Negative slump test bilaterally.  Psych: pleasant, normal affect     Assessment & Plan:  Mrs. Shyna Duignan is a 54 year old woman who presents for lower back pain that radiates into the bilateral lower extremities for >10 years.   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 -Given that her back pain is more painful to her than the radiation into her legs, I recommend a right L4-L5 facet joint medial branch  block to be scheduled with Dr. Wynn Banker. -Patient did have relief with Robaxin but it has worn off. Since pain is primarily at night, will prescribe Tizanidine HS PRN.  -Advised regarding worrisome signs she should look out for.   Morbid obesity with BMI 35.67: Given role of inflammation in contributing to pain, continue to monitor weight and provide education regarding importance of exercise and nutrition for weight loss. Current weight 221 lbs.

## 2019-08-31 ENCOUNTER — Ambulatory Visit: Payer: BC Managed Care – PPO | Admitting: Physical Medicine and Rehabilitation

## 2019-09-22 ENCOUNTER — Other Ambulatory Visit: Payer: Self-pay

## 2019-09-22 ENCOUNTER — Encounter: Payer: Self-pay | Admitting: Physical Medicine & Rehabilitation

## 2019-09-22 ENCOUNTER — Encounter
Payer: BC Managed Care – PPO | Attending: Physical Medicine and Rehabilitation | Admitting: Physical Medicine & Rehabilitation

## 2019-09-22 VITALS — BP 138/84 | HR 82 | Temp 97.9°F | Ht 66.0 in | Wt 220.6 lb

## 2019-09-22 DIAGNOSIS — M47816 Spondylosis without myelopathy or radiculopathy, lumbar region: Secondary | ICD-10-CM | POA: Diagnosis not present

## 2019-09-22 DIAGNOSIS — G894 Chronic pain syndrome: Secondary | ICD-10-CM | POA: Diagnosis not present

## 2019-09-22 DIAGNOSIS — M5417 Radiculopathy, lumbosacral region: Secondary | ICD-10-CM | POA: Insufficient documentation

## 2019-09-22 DIAGNOSIS — Z79891 Long term (current) use of opiate analgesic: Secondary | ICD-10-CM | POA: Insufficient documentation

## 2019-09-22 DIAGNOSIS — Z5181 Encounter for therapeutic drug level monitoring: Secondary | ICD-10-CM | POA: Diagnosis present

## 2019-09-22 MED ORDER — DIAZEPAM 5 MG PO TABS: 5.0000 mg | ORAL_TABLET | Freq: Once | ORAL | 1 refills | Status: AC

## 2019-09-22 NOTE — Patient Instructions (Signed)
Lumbar medial branch blocks were performed. This is to help diagnose the cause of the low back pain. It is important that you keep track of your pain for the first day or 2 after injection. This injection can give you temporary relief that lasts for hours or up to several months. There is no way to predict duration of pain relief.  Please try to compare your pain after injection to for the injection.  If this injection gives you  temporary relief it may be repeated and there may be another longer-lasting procedure that may be beneficial called radiofrequency ablation

## 2019-09-22 NOTE — Progress Notes (Signed)
  PROCEDURE RECORD [facility]   Name: Gerardo Territo DOB:[birth year] MRN: [id number]  Date:09/22/2019  Physician: Claudette Laws, MD    Nurse/CMA: Imri Lor RN  Allergies: No Known Allergies  Consent Signed: Yes.    Is patient diabetic? No.  CBG today?   Pregnant: No. LMP: No LMP recorded. (Menstrual status: Perimenopausal). (age [age])  Anticoagulants: no Anti-inflammatory: no Antibiotics: no  Procedure:   Right  Medial Branch Block Position: Prone Start Time: 2:47  End Time: 2:54 Fluoro Time: 38  RN/CMA Tye Vigo RN ShumakerRN    Time 2:20 3:00    BP 138/84 150/81    Pulse 82 81    Respirations 14 14    O2 Sat 97 100    S/S 6 6    Pain Level 6/10 0/10     D/C home with husband  [name] & O X 3, D/C instructions reviewed, and sits independently.

## 2019-09-22 NOTE — Progress Notes (Signed)
Right lumbar L3, L4 medial branch blocks and L5 dorsal ramus injection under fluoroscopic guidance  Indication: Right Lumbar pain which is not relieved by medication management or other conservative care and interfering with self-care and mobility.  Informed consent was obtained after describing risks and benefits of the procedure with the patient, this includes bleeding, bruising, infection, paralysis and medication side effects. The patient wishes to proceed and has given written consent. The patient was placed in a prone position. The lumbar area was marked and prepped with Betadine. One ML of 1% lidocaine was injected into each of 3 areas into the skin and subcutaneous tissue. Then a 22-gauge 5in spinal needle was inserted targeting the junction of the Right S1 superior articular process and sacral ala junction. Needle was advanced under fluoroscopic guidance. Bone contact was made.Isovue 200 was injected x0.5 mL demonstrating no intravascular uptake. Then a solution containing 2% MPF lidocaine was injected x0.5 mL. Then the Right L5 superior articular process in transverse process junction was targeted. Bone contact was made.Isovue 200 was injected x0.5 mL demonstrating no intravascular uptake. Then a solution containing 2% MPF lidocaine was injected x0.5 mL. Then the Right L4 superior articular process in transverse process junction was targeted. Bone contact was made. Isovue 200 was injected x0.5 mL demonstrating no intravascular uptake. Then a solution containing2% MPF lidocaine was injected x0.5 mL Patient tolerated procedure well. Post procedure instructions were given. Please refer to post procedure form.  Could use 3.5 in needle in future   Pre inj pain 6-7/10 Post inj pain 0/10  Pt will f/u with Dr Carlis Abbott in 1 wk

## 2019-09-29 ENCOUNTER — Encounter: Payer: BC Managed Care – PPO | Admitting: Physical Medicine and Rehabilitation

## 2019-10-17 ENCOUNTER — Other Ambulatory Visit: Payer: Self-pay

## 2019-10-17 ENCOUNTER — Encounter: Payer: Self-pay | Admitting: Physical Medicine and Rehabilitation

## 2019-10-17 ENCOUNTER — Encounter: Payer: BC Managed Care – PPO | Admitting: Physical Medicine and Rehabilitation

## 2019-10-17 VITALS — BP 138/78 | HR 80 | Temp 97.5°F | Ht 66.0 in | Wt 219.0 lb

## 2019-10-17 DIAGNOSIS — M47816 Spondylosis without myelopathy or radiculopathy, lumbar region: Secondary | ICD-10-CM | POA: Diagnosis not present

## 2019-10-17 DIAGNOSIS — G894 Chronic pain syndrome: Secondary | ICD-10-CM | POA: Diagnosis not present

## 2019-10-17 NOTE — Progress Notes (Signed)
Subjective:    Patient ID: Alison Burch, female    DOB: 11/11/1965, 54 y.o.   MRN: [id number]  HPI  Alison Burch returns for follow-up after having Right lumbar L3, L4 medial branch blocks and L5 dorsal ramus injection under fluoroscopic guidance by Dr. Wynn Banker on 09/22/19.   She had 1.5 weeks of pain relief and pain has since returned to baseline. She would like to learn more about the side effects of radiofrequency nerve ablation as well as what exactly the procedure entails.   She is no longer taking the tizanidine or ibuprofen or meloxicam. She does take Excedrin once per day as needed for pain. She does not feel she needs additional pain medication at this time.  Her sister is a physical therapy assistant and has provided her with core strengthening exercises.  Current weight is 219 lbs, BMI 35.35.   Prior history from 5/12: Alison Burch is a 54 year old woman who presents for lower back pain that radiates into the bilateral lower extremities for >10 years. The pain that radiates into her legs just started three months ago after she has had a fall.   She has had spinal steroid injections by Dr. Ethelene Hal that provided several years of relief.Her last injection was many years ago. She is not sure what type of injections these were.   More recently she had an injection in her buttocks (>6 months ago) that did not provide much relief.   She does note fecal incontinence about once per week that is new since her fall three months ago. Denies weakness in her lower extremities, saddle anesthesia, or urinary incontinence.   She is interested in getting another steroid injection since this helped so much before.   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  She has been using heating pads and warm showers which help a lot with the pain. She has tried Tylenol and NSAIDs. The pain located in her back is worse than the pain radiating into her  legs.   Pain is worst with flexion and twisting.It is worst at night. She got relief from Robaxin initially but it has since worn off.  Pain Inventory Average Pain 9 Pain Right Now 4 My pain is intermittent, sharp and stabbing  In the last 24 hours, has pain interfered with the following? General activity 0 Relation with others 0 Enjoyment of life 0 What TIME of day is your pain at its worst? Anytime. Sleep (in general) Poor  Pain is worse with: bending and sitting Pain improves with: medication and standing and streching. Relief from Meds: 3  Mobility walk without assistance how many minutes can you walk? No problem walking.  ability to climb steps?  yes do you drive?  yes Do you have any goals in this area?  yes  Function employed # of hrs/week 40 hr week what is your job? [employer] at [employer] I need assistance with the following:  No help needed. Do you have any goals in this area?  yes  Neuro/Psych bladder control problems bowel control problems weakness depression  Prior Studies Any changes since last visit?  no  Physicians involved in your care Any changes since last visit?  no   Family History  Problem Relation Age of Onset  . Hyperlipidemia Father   . Hypertension Father   . Heart disease Father   . Cancer Father        colon  .  Cancer Paternal Aunt        breast   Social History   Socioeconomic History  . Marital status: Divorced    Spouse name: Not on file  . Number of children: Not on file  . Years of education: Not on file  . Highest education level: Not on file  Occupational History  . Not on file  Tobacco Use  . Smoking status: Never Smoker  . Smokeless tobacco: Never Used  Substance and Sexual Activity  . Alcohol use: Yes    Comment: occAsional   . Drug use: No  . Sexual activity: Not on file  Other Topics Concern  . Not on file  Social History Narrative  . Not on file   Social Determinants of Health   Financial Resource  Strain:   . Difficulty of Paying Living Expenses:   Food Insecurity:   . Worried About Programme researcher, broadcasting/film/video in the Last Year:   . Barista in the Last Year:   Transportation Needs:   . Freight forwarder (Medical):   Marland Kitchen Lack of Transportation (Non-Medical):   Physical Activity:   . Days of Exercise per Week:   . Minutes of Exercise per Session:   Stress:   . Feeling of Stress :   Social Connections:   . Frequency of Communication with Friends and Family:   . Frequency of Social Gatherings with Friends and Family:   . Attends Religious Services:   . Active Member of Clubs or Organizations:   . Attends Banker Meetings:   Marland Kitchen Marital Status:    Past Surgical History:  Procedure Laterality Date  . BREATH TEK H PYLORI N/A 08/07/2013   Procedure: BREATH TEK H PYLORI;  Surgeon: Kandis Cocking, MD;  Location: [facility];  Service: General;  Laterality: N/A;  . KNEE RECONSTRUCTION     knee cap  . LAPAROSCOPIC GASTRIC SLEEVE RESECTION N/A 10/17/2013   Procedure: LAPAROSCOPIC GASTRIC SLEEVE RESECTION;  Surgeon: Kandis Cocking, MD;  Location: [facility];  Service: General;  Laterality: N/A;  . UPPER GI ENDOSCOPY  10/17/2013   Procedure: UPPER GI ENDOSCOPY;  Surgeon: Kandis Cocking, MD;  Location: [facility];  Service: General;;   Past Medical History:  Diagnosis Date  . Anxiety   . Depression   . Headache(784.0)    occasinal migraine  . Hyperlipidemia   . Hypertension    BP 138/78   Pulse 80   Temp (!) 97.5 F (36.4 C)   Ht 5\' 6"  (1.676 m)   Wt 219 lb (99.3 kg)   SpO2 97%   BMI 35.35 kg/m   Opioid Risk Score:   Fall Risk Score:  `1  Depression screen PHQ 2/9  Depression screen [facility] 2/9 09/22/2019 08/03/2019  Decreased Interest 0 1  Down, Depressed, Hopeless 0 1  PHQ - 2 Score 0 2  Altered sleeping - 3  Tired, decreased energy - 2  Change in appetite - 1  Feeling bad or failure about yourself  - 0  Trouble concentrating - 0  Moving slowly or  fidgety/restless - 0  Suicidal thoughts - 0  PHQ-9 Score - 8  Difficult doing work/chores - Somewhat difficult   Review of Systems  Constitutional: Negative.   HENT: Negative.   Eyes: Negative.   Respiratory: Negative.   Cardiovascular: Negative.   Gastrointestinal:       Can not control bowels.  Endocrine: Negative.   Genitourinary: Positive for urgency.  Musculoskeletal:  Positive for back pain. Negative for gait problem.  Skin: Negative.   Neurological: Positive for weakness.  Psychiatric/Behavioral:       Depression       Objective:   Physical Exam Gen: no distress, normal appearing HEENT: oral mucosa pink and moist, NCAT Cardio: Reg rate Chest: normal effort, normal rate of breathing Abd: soft, non-distended Ext: no edema Skin: intact Neuro:AOx3. Musculoskeletal: 5/5 strength throughout Sensation is intact throughout. Has full and painfless flexion and extension of lumbar spine. No tenderness to palpation over spine.  Negative slump test bilaterally. Psych: pleasant, normal affect    Assessment & Plan:  Alison Burch is a 54 year old woman who presents for lower back pain that radiates into the bilateral lower extremities for >10 years.   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 -Given that her back pain is more painful to her than the radiation into her legs, I recommended right L3 and L4 medial branch blocks and L5 dorsal ramus injection with Dr. Wynn Banker. She had 1.5 weeks of relief. Repeat injections scheduled this Friday. Discussed that if she gets relief from this injection as well, she can schedule radiofrequency ablation which will provide longer relief. Educated regarding side effects.  -No longer requiring muscle relaxers. May continue Excedrin as needed for pain relief. Do not take with NSAIDs.   -Advised regarding worrisome signs she should look out  for.  Morbid obesity with BMI 35.35: Given role of inflammation in contributing to pain, continue to monitor weight and provide education regarding importance of exercise and nutrition for weight loss. Current weight 219 lbs, down 2 lbs from 1 month ago.   All questions answered. RTC in 2 months.

## 2019-10-20 ENCOUNTER — Other Ambulatory Visit: Payer: Self-pay

## 2019-10-20 ENCOUNTER — Encounter
Payer: BC Managed Care – PPO | Attending: Physical Medicine and Rehabilitation | Admitting: Physical Medicine & Rehabilitation

## 2019-10-20 ENCOUNTER — Encounter: Payer: Self-pay | Admitting: Physical Medicine & Rehabilitation

## 2019-10-20 VITALS — BP 106/73 | HR 78 | Temp 97.9°F | Ht 66.0 in | Wt 218.8 lb

## 2019-10-20 DIAGNOSIS — G894 Chronic pain syndrome: Secondary | ICD-10-CM | POA: Diagnosis not present

## 2019-10-20 DIAGNOSIS — M5417 Radiculopathy, lumbosacral region: Secondary | ICD-10-CM | POA: Diagnosis present

## 2019-10-20 DIAGNOSIS — Z5181 Encounter for therapeutic drug level monitoring: Secondary | ICD-10-CM | POA: Diagnosis present

## 2019-10-20 DIAGNOSIS — M47816 Spondylosis without myelopathy or radiculopathy, lumbar region: Secondary | ICD-10-CM | POA: Diagnosis not present

## 2019-10-20 DIAGNOSIS — Z79891 Long term (current) use of opiate analgesic: Secondary | ICD-10-CM | POA: Diagnosis not present

## 2019-10-20 MED ORDER — DIAZEPAM 5 MG PO TABS: 10.0000 mg | ORAL_TABLET | Freq: Once | ORAL | 0 refills | Status: AC

## 2019-10-20 NOTE — Progress Notes (Signed)
Right lumbar L3, L4 medial branch blocks and L5 dorsal ramus injection under fluoroscopic guidance  Indication: Right Lumbar pain which is not relieved by medication management or other conservative care and interfering with self-care and mobility.  Informed consent was obtained after describing risks and benefits of the procedure with the patient, this includes bleeding, bruising, infection, paralysis and medication side effects. The patient wishes to proceed and has given written consent. The patient was placed in a prone position. The lumbar area was marked and prepped with Betadine. One ML of 1% lidocaine was injected into each of 3 areas into the skin and subcutaneous tissue. Then a 22-gauge 5in spinal needle was inserted targeting the junction of the Right S1 superior articular process and sacral ala junction. Needle was advanced under fluoroscopic guidance. Bone contact was made.Isovue 200 was injected x0.5 mL demonstrating no intravascular uptake. Then a solution containing 2% MPF lidocaine was injected x0.5 mL. Then the Right L5 superior articular process in transverse process junction was targeted. Bone contact was made.Isovue 200 was injected x0.5 mL demonstrating no intravascular uptake. Then a solution containing 2% MPF lidocaine was injected x0.5 mL. Then the Right L4 superior articular process in transverse process junction was targeted. Bone contact was made. Isovue 200 was injected x0.5 mL demonstrating no intravascular uptake. Then a solution containing2% MPF lidocaine was injected x0.5 mL Patient tolerated procedure well. Post procedure instructions were given. Please refer to post procedure form.    Pre inj pain 5/10 Post inj pain 0/10  100% pain relief x 2 sers of Right L3-4-5 MBB, schedule for RF of same nerves, premed with valium 10mg  ~1hr prior to procedure, explained procedure to pt today   Pt will f/u with Dr in 1 wk

## 2019-10-20 NOTE — Patient Instructions (Signed)
Lumbar medial branch blocks were performed. This is to help diagnose the cause of the low back pain. It is important that you keep track of your pain for the first day or 2 after injection. This injection can give you temporary relief that lasts for hours or up to several months. There is no way to predict duration of pain relief.  Please try to compare your pain after injection to for the injection.  If this injection gives you  temporary relief there may be another longer-lasting procedure that may be beneficial call radiofrequency ablation  Would recommend valium 10mg  prior to procedure

## 2019-10-20 NOTE — Progress Notes (Signed)
  PROCEDURE RECORD [facility]   Name: Barbera Perritt DOB:1965-06-24 MRN: [id number]  Date:10/20/2019  Physician: Claudette Laws, MD    Nurse/CMA: Jimmey Ralph CMA / LEE CMA  Allergies: No Known Allergies  Consent Signed: Yes.    Is patient diabetic? No.  CBG today? N/A  Pregnant: No. LMP: No LMP recorded. (Menstrual status: Perimenopausal). (age 54-55)  Anticoagulants: no Anti-inflammatory: no Antibiotics: no  Procedure: Right L3-4-5 Medial Branch Block    Position: Prone Start Time:11:32 am  End Time:11:38 am  Fluoro Time: 27  RN/CMA Tarica Harl  CMA Javier Mamone CMA    Time 10:47 am 11:44    BP 110/73 127/77    Pulse 78 81    Respirations 16 16    O2 Sat 97 98    S/S 6 6    Pain Level 5 0     D/C home with Tommy, [name] & O X 3, D/C instructions reviewed, and sits independently.

## 2019-10-20 NOTE — Progress Notes (Deleted)
   Subjective:    Patient ID: Alison Burch, female    DOB: 1965-10-29, 54 y.o.   MRN: [id number]  HPI    Review of Systems     Objective:   Physical Exam        Assessment & Plan:

## 2019-11-17 ENCOUNTER — Other Ambulatory Visit: Payer: Self-pay

## 2019-11-17 ENCOUNTER — Encounter: Payer: Self-pay | Admitting: Physical Medicine & Rehabilitation

## 2019-11-17 ENCOUNTER — Encounter (HOSPITAL_BASED_OUTPATIENT_CLINIC_OR_DEPARTMENT_OTHER): Payer: BC Managed Care – PPO | Admitting: Physical Medicine & Rehabilitation

## 2019-11-17 VITALS — BP 138/82 | HR 95 | Temp 98.8°F | Ht 66.0 in | Wt 218.0 lb

## 2019-11-17 DIAGNOSIS — G894 Chronic pain syndrome: Secondary | ICD-10-CM | POA: Diagnosis not present

## 2019-11-17 DIAGNOSIS — M47816 Spondylosis without myelopathy or radiculopathy, lumbar region: Secondary | ICD-10-CM | POA: Diagnosis not present

## 2019-11-17 NOTE — Progress Notes (Signed)

## 2019-11-17 NOTE — Progress Notes (Signed)
  PROCEDURE RECORD [facility]   Name: Alison Burch DOB:1965/12/22 MRN: [id number]  Date:11/17/2019  Physician: Claudette Laws, MD    Nurse/CMA: Tamla Winkels, CMA   Allergies: No Known Allergies  Consent Signed: Yes.    Is patient diabetic? No.  CBG today?   Pregnant: No. LMP: No LMP recorded. (Menstrual status: Perimenopausal). (age [age])  Anticoagulants: no Anti-inflammatory: no Antibiotics: no  Procedure: right L3,4,5 radiofrequency neurotomy Position: Prone Start Time:  9:55am     End Time: 10:10AM   Fluoro Time: 55s  RN/CMA Claryssa Sandner, CMA Rogenia Werntz, CMA    Time 9:27am 10:17am    BP 138/82 114/79    Pulse 95 86    Respirations 14 14    O2 Sat 98 97    S/S 6 6    Pain Level 7/10 0/10     D/C home with boyfriend, [name] & O X 3, D/C instructions reviewed, and sits independently.

## 2019-11-17 NOTE — Patient Instructions (Signed)
You had a radio frequency procedure today This was done to alleviate joint pain in your lumbar area We injected lidocaine which is a local anesthetic.  You may experience soreness at the injection sites. You may also experienced some irritation of the nerves that were heated I'm recommending ice for 30 minutes every 2 hours as needed for the next 24-48 hours   

## 2019-12-12 ENCOUNTER — Encounter
Payer: BC Managed Care – PPO | Attending: Physical Medicine and Rehabilitation | Admitting: Physical Medicine and Rehabilitation

## 2019-12-12 ENCOUNTER — Encounter: Payer: Self-pay | Admitting: Physical Medicine and Rehabilitation

## 2019-12-12 ENCOUNTER — Other Ambulatory Visit: Payer: Self-pay

## 2019-12-12 VITALS — BP 122/77 | HR 97 | Temp 98.5°F | Ht 66.0 in | Wt 217.0 lb

## 2019-12-12 DIAGNOSIS — M5417 Radiculopathy, lumbosacral region: Secondary | ICD-10-CM | POA: Diagnosis present

## 2019-12-12 DIAGNOSIS — M48062 Spinal stenosis, lumbar region with neurogenic claudication: Secondary | ICD-10-CM

## 2019-12-12 DIAGNOSIS — Z5181 Encounter for therapeutic drug level monitoring: Secondary | ICD-10-CM | POA: Insufficient documentation

## 2019-12-12 DIAGNOSIS — Z79891 Long term (current) use of opiate analgesic: Secondary | ICD-10-CM | POA: Diagnosis present

## 2019-12-12 DIAGNOSIS — G5701 Lesion of sciatic nerve, right lower limb: Secondary | ICD-10-CM

## 2019-12-12 DIAGNOSIS — G894 Chronic pain syndrome: Secondary | ICD-10-CM | POA: Insufficient documentation

## 2019-12-12 DIAGNOSIS — M47816 Spondylosis without myelopathy or radiculopathy, lumbar region: Secondary | ICD-10-CM

## 2019-12-12 NOTE — Progress Notes (Signed)
Subjective:    Patient ID: Alison Burch, female    DOB: Oct 24, 1965, 54 y.o.   MRN: [id number]  HPI Alison Burch returns for follow-up of her facet arthropathy and right sided piriformis syndrome.  She had excellent relief from her radiofrequency ablation with Dr. Wynn Banker on 7/30. Pain is currently down to 2 and she is able to mobilize more.  She asks about her right buttock pain and any exercises she can do for this.    Pain Inventory Average Pain 2 Pain Right Now 2 My pain is dull  LOCATION OF PAIN  R buttock  BOWEL Number of stools per week: back Oral laxative use No  Type of laxative n/a Enema or suppository use No  History of colostomy No  Incontinent No   BLADDER Normal In and out cath, frequency none Able to self cath No  Bladder incontinence Yes  Frequent urination No  Leakage with coughing No  Difficulty starting stream No  Incomplete bladder emptying No    Mobility walk without assistance ability to climb steps?  yes do you drive?  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  no  Physicians involved in your care Any changes since last visit?  no   Family History  Problem Relation Age of Onset  . Hyperlipidemia Father   . Hypertension Father   . Heart disease Father   . Cancer Father        colon  . Cancer Paternal Aunt        breast   Social History   Socioeconomic History  . Marital status: Divorced    Spouse name: Not on file  . Number of children: Not on file  . Years of education: Not on file  . Highest education level: Not on file  Occupational History  . Not on file  Tobacco Use  . Smoking status: Never Smoker  . Smokeless tobacco: Never Used  Substance and Sexual Activity  . Alcohol use: Yes    Comment: occAsional   . Drug use: No  . Sexual activity: Not on file  Other Topics Concern  .  Not on file  Social History Narrative  . Not on file   Social Determinants of Health   Financial Resource Strain:   . Difficulty of Paying Living Expenses: Not on file  Food Insecurity:   . Worried About Programme researcher, broadcasting/film/video in the Last Year: Not on file  . Ran Out of Food in the Last Year: Not on file  Transportation Needs:   . Lack of Transportation (Medical): Not on file  . Lack of Transportation (Non-Medical): Not on file  Physical Activity:   . Days of Exercise per Week: Not on file  . Minutes of Exercise per Session: Not on file  Stress:   . Feeling of Stress : Not on file  Social Connections:   . Frequency of Communication with Friends and Family: Not on file  . Frequency of Social Gatherings with Friends and Family: Not on file  . Attends Religious Services: Not on file  . Active Member of Clubs or Organizations: Not on file  . Attends Banker Meetings: Not on file  . Marital Status: Not on file   Past Surgical History:  Procedure Laterality Date  . BREATH TEK H PYLORI N/A 08/07/2013   Procedure: BREATH TEK H PYLORI;  Surgeon: Kandis Cocking, MD;  Location: [facility];  Service: General;  Laterality: N/A;  . KNEE RECONSTRUCTION     knee cap  . LAPAROSCOPIC GASTRIC SLEEVE RESECTION N/A 10/17/2013   Procedure: LAPAROSCOPIC GASTRIC SLEEVE RESECTION;  Surgeon: Kandis Cocking, MD;  Location: [facility];  Service: General;  Laterality: N/A;  . UPPER GI ENDOSCOPY  10/17/2013   Procedure: UPPER GI ENDOSCOPY;  Surgeon: Kandis Cocking, MD;  Location: [facility];  Service: General;;   Past Medical History:  Diagnosis Date  . Anxiety   . Depression   . Headache(784.0)    occasinal migraine  . Hyperlipidemia   . Hypertension    Ht 5\' 6"  (1.676 m)   Wt 217 lb (98.4 kg)   BMI 35.02 kg/m   Opioid Risk Score:   Fall Risk Score:  `1  Depression screen PHQ 2/9  Depression screen [facility] 2/9 10/20/2019 10/17/2019 09/22/2019 08/03/2019  Decreased Interest 0 0 0 1  Down,  Depressed, Hopeless 0 0 0 1  PHQ - 2 Score 0 0 0 2  Altered sleeping 0 0 - 3  Tired, decreased energy 0 0 - 2  Change in appetite 0 0 - 1  Feeling bad or failure about yourself  0 0 - 0  Trouble concentrating 0 0 - 0  Moving slowly or fidgety/restless 0 0 - 0  Suicidal thoughts 0 0 - 0  PHQ-9 Score 0 0 - 8  Difficult doing work/chores - - - Somewhat difficult   Review of Systems  Constitutional: Negative.   HENT: Negative.   Eyes: Negative.   Respiratory: Negative.   Cardiovascular: Negative.   Gastrointestinal: Negative.   Endocrine: Negative.   Genitourinary: Negative.   Musculoskeletal: Positive for back pain.  Allergic/Immunologic: Negative.   Neurological: Negative.   Hematological: Negative.   Psychiatric/Behavioral: Negative.   All other systems reviewed and are negative.      Objective:   Physical Exam Gen: no distress, normal appearing HEENT: oral mucosa pink and moist, NCAT Cardio: Reg rate Chest: normal effort, normal rate of breathing Abd: soft, non-distended Ext: no edema Skin: intact Neuro: Alert and oriented x3 Musculoskeletal: R buttock tenderness over piriformis muscle Psych: pleasant, normal affect     Assessment & Plan:  Alison Burch is a 54 year old woman who presents for follow-up of her lumbar facet arthropathy and piriformis syndrome.  Facet arthropathy: -Resolved with radiofrequency ablation -Advised that pain relief should last on average of 1 year. Scheduled 1 year follow-up.  Right piriformis syndrome: -Provided with exercises she can do at home which she prefers to physical therapy at this time.  -Advised use of foam roller and tennis ball. -Advised use of heat pads and hot shower to relax muscle.  Advised that she can call or use MyChart to message if she has any concerns earlier than 1 year.

## 2020-12-11 ENCOUNTER — Ambulatory Visit: Payer: BC Managed Care – PPO | Admitting: Physical Medicine and Rehabilitation
